# Patient Record
Sex: Female | Born: 1975 | Hispanic: No | Marital: Single | Smoking: Never smoker
Health system: Southern US, Community
[De-identification: ages and names within clinical notes are randomized; demographics above are authoritative.]

## PROBLEM LIST (undated history)

## (undated) DIAGNOSIS — C9590 Leukemia, unspecified not having achieved remission: Secondary | ICD-10-CM

## (undated) DIAGNOSIS — F32A Depression, unspecified: Secondary | ICD-10-CM

## (undated) DIAGNOSIS — Z923 Personal history of irradiation: Secondary | ICD-10-CM

## (undated) DIAGNOSIS — C801 Malignant (primary) neoplasm, unspecified: Secondary | ICD-10-CM

## (undated) DIAGNOSIS — F329 Major depressive disorder, single episode, unspecified: Secondary | ICD-10-CM

## (undated) DIAGNOSIS — R569 Unspecified convulsions: Secondary | ICD-10-CM

## (undated) DIAGNOSIS — Z9221 Personal history of antineoplastic chemotherapy: Secondary | ICD-10-CM

## (undated) HISTORY — PX: GALLBLADDER SURGERY: SHX652

## (undated) HISTORY — DX: Major depressive disorder, single episode, unspecified: F32.9

## (undated) HISTORY — DX: Malignant (primary) neoplasm, unspecified: C80.1

## (undated) HISTORY — PX: BREAST SURGERY: SHX581

## (undated) HISTORY — DX: Depression, unspecified: F32.A

## (undated) HISTORY — DX: Unspecified convulsions: R56.9

---

## 2010-06-02 HISTORY — PX: REDUCTION MAMMAPLASTY: SUR839

## 2012-08-17 ENCOUNTER — Emergency Department: Payer: Self-pay | Admitting: Emergency Medicine

## 2012-08-17 LAB — URINALYSIS, COMPLETE
Bilirubin,UR: NEGATIVE
Blood: NEGATIVE
Leukocyte Esterase: NEGATIVE
Ph: 8 (ref 4.5–8.0)
Protein: NEGATIVE
RBC,UR: NONE SEEN /HPF (ref 0–5)

## 2012-08-17 LAB — PHENOBARBITAL LEVEL: Phenobarbital: 19.7 ug/mL (ref 15.0–40.0)

## 2012-08-17 LAB — BASIC METABOLIC PANEL
Calcium, Total: 8.5 mg/dL (ref 8.5–10.1)
Chloride: 107 mmol/L (ref 98–107)
EGFR (African American): >60
Glucose: 93 mg/dL (ref 65–99)
Osmolality: 280 (ref 275–301)
Potassium: 4.1 mmol/L (ref 3.5–5.1)
Sodium: 140 mmol/L (ref 136–145)

## 2012-08-17 LAB — CBC WITH DIFFERENTIAL/PLATELET
Basophil %: 0.5 %
Eosinophil #: 0 10*3/uL (ref 0.0–0.7)
Eosinophil %: 0.6 %
HCT: 38.7 % (ref 35.0–47.0)
HGB: 13.4 g/dL (ref 12.0–16.0)
MCH: 29.1 pg (ref 26.0–34.0)
MCHC: 34.5 g/dL (ref 32.0–36.0)
Monocyte #: 0.5 x10 3/mm (ref 0.2–0.9)
Monocyte %: 6.5 %
Neutrophil #: 4.8 10*3/uL (ref 1.4–6.5)
Neutrophil %: 68.8 %
RBC: 4.59 10*6/uL (ref 3.80–5.20)
RDW: 14.3 % (ref 11.5–14.5)

## 2012-08-17 LAB — PREGNANCY, URINE: Pregnancy Test, Urine: NEGATIVE m[IU]/mL

## 2012-11-11 ENCOUNTER — Ambulatory Visit: Payer: Self-pay

## 2012-11-11 LAB — MONONUCLEOSIS SCREEN: Mono Test: NEGATIVE

## 2012-11-11 LAB — RAPID STREP-A WITH REFLX: Micro Text Report: NEGATIVE

## 2012-11-11 LAB — CBC WITH DIFFERENTIAL/PLATELET
Basophil #: 0 10*3/uL (ref 0.0–0.1)
Basophil %: 0.7 %
Eosinophil #: 0 10*3/uL (ref 0.0–0.7)
Eosinophil %: 0.6 %
HCT: 38.1 % (ref 35.0–47.0)
HGB: 13 g/dL (ref 12.0–16.0)
Lymphocyte #: 1.9 10*3/uL (ref 1.0–3.6)
Lymphocyte %: 27.5 %
MCH: 28.7 pg (ref 26.0–34.0)
MCHC: 34.1 g/dL (ref 32.0–36.0)
MCV: 84 fL (ref 80–100)
Monocyte #: 0.4 x10 3/mm (ref 0.2–0.9)
Monocyte %: 6.5 %
Neutrophil #: 4.5 10*3/uL (ref 1.4–6.5)
Neutrophil %: 64.7 %
Platelet: 279 10*3/uL (ref 150–440)
RBC: 4.54 10*6/uL (ref 3.80–5.20)
RDW: 14.6 % — ABNORMAL HIGH (ref 11.5–14.5)
WBC: 6.9 10*3/uL (ref 3.6–11.0)

## 2013-02-05 ENCOUNTER — Telehealth: Payer: Self-pay | Admitting: Neurology

## 2013-02-05 NOTE — Telephone Encounter (Signed)
I received a call from her oral surgeon, she is planning on to have her tooth extracted at his office under light iv sedation, using versed, proprafol.  She has frequent complex partial seizure, it should be ok to proceed with planned procedure at office, make sure that she is compliance with her medications.

## 2013-04-08 ENCOUNTER — Other Ambulatory Visit: Payer: Self-pay

## 2013-04-08 MED ORDER — PHENOBARBITAL 32.4 MG PO TABS: 32.4000 mg | ORAL_TABLET | Freq: Two times a day (BID) | ORAL | Status: DC

## 2013-04-08 NOTE — Telephone Encounter (Signed)
[facility] called, left message requesting refills on Phenobarb for this patient.

## 2013-08-08 ENCOUNTER — Ambulatory Visit: Payer: Self-pay | Admitting: Family Medicine

## 2013-08-26 DIAGNOSIS — F819 Developmental disorder of scholastic skills, unspecified: Secondary | ICD-10-CM | POA: Insufficient documentation

## 2013-08-27 DIAGNOSIS — G40909 Epilepsy, unspecified, not intractable, without status epilepticus: Secondary | ICD-10-CM | POA: Insufficient documentation

## 2013-09-10 ENCOUNTER — Other Ambulatory Visit: Payer: Self-pay

## 2013-09-10 MED ORDER — PHENOBARBITAL 32.4 MG PO TABS: 32.4000 mg | ORAL_TABLET | Freq: Two times a day (BID) | ORAL | Status: DC

## 2013-09-10 NOTE — Telephone Encounter (Signed)
Patient called requesting refills on Phenobarb.  She would like Rx sent to [facility], and has an appt scheduled in Feb. If there are any questions, she can be reached at [phone]

## 2013-09-12 NOTE — Telephone Encounter (Signed)
Rx faxed

## 2013-09-30 ENCOUNTER — Ambulatory Visit: Payer: Self-pay | Admitting: Nurse Practitioner

## 2013-10-21 ENCOUNTER — Ambulatory Visit (INDEPENDENT_AMBULATORY_CARE_PROVIDER_SITE_OTHER): Payer: Medicaid Other

## 2013-10-21 ENCOUNTER — Encounter: Payer: Self-pay | Admitting: Podiatry

## 2013-10-21 ENCOUNTER — Ambulatory Visit (INDEPENDENT_AMBULATORY_CARE_PROVIDER_SITE_OTHER): Payer: Medicaid Other | Admitting: Podiatry

## 2013-10-21 VITALS — BP 122/83 | HR 77 | Resp 16 | Ht <= 58 in | Wt 141.0 lb

## 2013-10-21 DIAGNOSIS — M722 Plantar fascial fibromatosis: Secondary | ICD-10-CM

## 2013-10-21 NOTE — Progress Notes (Signed)
   Subjective:    Patient ID: Dana Carroll, female    DOB: [birth year], 38 y.o.   MRN: [id number]  HPI Comments: Right back of heel pain is sore , its been going about 3 months at the most, just woke up  One day out of the blue with it hurting and it has been hurting since and its been about the same, some days have been worse , not able to walk and get out of the bed on her own, hurts more to walk on it . No type of treatment for the foot   Foot Pain  left plantar heel pain, not the right foot     Review of Systems  Neurological: Positive for seizures.  All other systems reviewed and are negative.       Objective:   Physical Exam I have reviewed her past medical history medications allergies surgeries and social history. Pulses are strongly palpable bilateral. Neurologic sensorium is intact bilateral. Orthopedic evaluation demonstrates pain on palpation medial continued tubercle of the heel. Ready graphic evaluation does demonstrate a plantar distally oriented calcaneal heel spur of the left heel with soft tissue increase in density at the plantar fascial calcaneal insertion site as well as the tendo Achilles insertion site.        Assessment & Plan:  Assessment: Plantar fasciitis and compensatory Achilles tendinitis left.  Plan: Discussed etiology pathology conservative versus surgical therapies injected the area today discussed appropriate shoe gear stretching exercises ice therapy shoe gear modifications. I will followup with her in one month.

## 2013-10-21 NOTE — Patient Instructions (Signed)
Plantar Fasciitis (Heel Spur Syndrome) with Rehab The plantar fascia is a fibrous, ligament-like, soft-tissue structure that spans the bottom of the foot. Plantar fasciitis is a condition that causes pain in the foot due to inflammation of the tissue. SYMPTOMS   Pain and tenderness on the underneath side of the foot.  Pain that worsens with standing or walking. CAUSES  Plantar fasciitis is caused by irritation and injury to the plantar fascia on the underneath side of the foot. Common mechanisms of injury include:  Direct trauma to bottom of the foot.  Damage to a small nerve that runs under the foot where the main fascia attaches to the heel bone.  Stress placed on the plantar fascia due to bone spurs. RISK INCREASES WITH:   Activities that place stress on the plantar fascia (running, jumping, pivoting, or cutting).  Poor strength and flexibility.  Improperly fitted shoes.  Tight calf muscles.  Flat feet.  Failure to warm-up properly before activity.  Obesity. PREVENTION  Warm up and stretch properly before activity.  Allow for adequate recovery between workouts.  Maintain physical fitness:  Strength, flexibility, and endurance.  Cardiovascular fitness.  Maintain a health body weight.  Avoid stress on the plantar fascia.  Wear properly fitted shoes, including arch supports for individuals who have flat feet. PROGNOSIS  If treated properly, then the symptoms of plantar fasciitis usually resolve without surgery. However, occasionally surgery is necessary. RELATED COMPLICATIONS   Recurrent symptoms that may result in a chronic condition.  Problems of the lower back that are caused by compensating for the injury, such as limping.  Pain or weakness of the foot during push-off following surgery.  Chronic inflammation, scarring, and partial or complete fascia tear, occurring more often from repeated injections. TREATMENT  Treatment initially involves the use of  ice and medication to help reduce pain and inflammation. The use of strengthening and stretching exercises may help reduce pain with activity, especially stretches of the Achilles tendon. These exercises may be performed at home or with a therapist. Your caregiver may recommend that you use heel cups of arch supports to help reduce stress on the plantar fascia. Occasionally, corticosteroid injections are given to reduce inflammation. If symptoms persist for greater than 6 months despite non-surgical (conservative), then surgery may be recommended.  MEDICATION   If pain medication is necessary, then nonsteroidal anti-inflammatory medications, such as aspirin and ibuprofen, or other minor pain relievers, such as acetaminophen, are often recommended.  Do not take pain medication within 7 days before surgery.  Prescription pain relievers may be given if deemed necessary by your caregiver. Use only as directed and only as much as you need.  Corticosteroid injections may be given by your caregiver. These injections should be reserved for the most serious cases, because they may only be given a certain number of times. HEAT AND COLD  Cold treatment (icing) relieves pain and reduces inflammation. Cold treatment should be applied for 10 to 15 minutes every 2 to 3 hours for inflammation and pain and immediately after any activity that aggravates your symptoms. Use ice packs or massage the area with a piece of ice (ice massage).  Heat treatment may be used prior to performing the stretching and strengthening activities prescribed by your caregiver, physical therapist, or athletic trainer. Use a heat pack or soak the injury in warm water. SEEK IMMEDIATE MEDICAL CARE IF:  Treatment seems to offer no benefit, or the condition worsens.  Any medications produce adverse side effects. EXERCISES RANGE   OF MOTION (ROM) AND STRETCHING EXERCISES - Plantar Fasciitis (Heel Spur Syndrome) These exercises may help you  when beginning to rehabilitate your injury. Your symptoms may resolve with or without further involvement from your physician, physical therapist or athletic trainer. While completing these exercises, remember:   Restoring tissue flexibility helps normal motion to return to the joints. This allows healthier, less painful movement and activity.  An effective stretch should be held for at least 30 seconds.  A stretch should never be painful. You should only feel a gentle lengthening or release in the stretched tissue. RANGE OF MOTION - Toe Extension, Flexion  Sit with your right / left leg crossed over your opposite knee.  Grasp your toes and gently pull them back toward the top of your foot. You should feel a stretch on the bottom of your toes and/or foot.  Hold this stretch for __________ seconds.  Now, gently pull your toes toward the bottom of your foot. You should feel a stretch on the top of your toes and or foot.  Hold this stretch for __________ seconds. Repeat __________ times. Complete this stretch __________ times per day.  RANGE OF MOTION - Ankle Dorsiflexion, Active Assisted  Remove shoes and sit on a chair that is preferably not on a carpeted surface.  Place right / left foot under knee. Extend your opposite leg for support.  Keeping your heel down, slide your right / left foot back toward the chair until you feel a stretch at your ankle or calf. If you do not feel a stretch, slide your bottom forward to the edge of the chair, while still keeping your heel down.  Hold this stretch for __________ seconds. Repeat __________ times. Complete this stretch __________ times per day.  STRETCH  Gastroc, Standing  Place hands on wall.  Extend right / left leg, keeping the front knee somewhat bent.  Slightly point your toes inward on your back foot.  Keeping your right / left heel on the floor and your knee straight, shift your weight toward the wall, not allowing your back to  arch.  You should feel a gentle stretch in the right / left calf. Hold this position for __________ seconds. Repeat __________ times. Complete this stretch __________ times per day. STRETCH  Soleus, Standing  Place hands on wall.  Extend right / left leg, keeping the other knee somewhat bent.  Slightly point your toes inward on your back foot.  Keep your right / left heel on the floor, bend your back knee, and slightly shift your weight over the back leg so that you feel a gentle stretch deep in your back calf.  Hold this position for __________ seconds. Repeat __________ times. Complete this stretch __________ times per day. STRETCH  Gastrocsoleus, Standing  Note: This exercise can place a lot of stress on your foot and ankle. Please complete this exercise only if specifically instructed by your caregiver.   Place the ball of your right / left foot on a step, keeping your other foot firmly on the same step.  Hold on to the wall or a rail for balance.  Slowly lift your other foot, allowing your body weight to press your heel down over the edge of the step.  You should feel a stretch in your right / left calf.  Hold this position for __________ seconds.  Repeat this exercise with a slight bend in your right / left knee. Repeat __________ times. Complete this stretch __________ times per day.    STRENGTHENING EXERCISES - Plantar Fasciitis (Heel Spur Syndrome)  These exercises may help you when beginning to rehabilitate your injury. They may resolve your symptoms with or without further involvement from your physician, physical therapist or athletic trainer. While completing these exercises, remember:   Muscles can gain both the endurance and the strength needed for everyday activities through controlled exercises.  Complete these exercises as instructed by your physician, physical therapist or athletic trainer. Progress the resistance and repetitions only as guided. STRENGTH - Towel  Curls  Sit in a chair positioned on a non-carpeted surface.  Place your foot on a towel, keeping your heel on the floor.  Pull the towel toward your heel by only curling your toes. Keep your heel on the floor.  If instructed by your physician, physical therapist or athletic trainer, add ____________________ at the end of the towel. Repeat __________ times. Complete this exercise __________ times per day. STRENGTH - Ankle Inversion  Secure one end of a rubber exercise band/tubing to a fixed object (table, pole). Loop the other end around your foot just before your toes.  Place your fists between your knees. This will focus your strengthening at your ankle.  Slowly, pull your big toe up and in, making sure the band/tubing is positioned to resist the entire motion.  Hold this position for __________ seconds.  Have your muscles resist the band/tubing as it slowly pulls your foot back to the starting position. Repeat __________ times. Complete this exercises __________ times per day.  Document Released: 09/18/2005 Document Revised: 12/11/2011 Document Reviewed: 12/31/2008 Exit[employer] Patient Information 2014 [employer]. Plantar Fasciitis Plantar fasciitis is a common condition that causes foot pain. It is soreness (inflammation) of the band of tough fibrous tissue on the bottom of the foot that runs from the heel bone (calcaneus) to the ball of the foot. The cause of this soreness may be from excessive standing, poor fitting shoes, running on hard surfaces, being overweight, having an abnormal walk, or overuse (this is common in runners) of the painful foot or feet. It is also common in aerobic exercise dancers and ballet dancers. SYMPTOMS  Most people with plantar fasciitis complain of:  Severe pain in the morning on the bottom of their foot especially when taking the first steps out of bed. This pain recedes after a few minutes of walking.  Severe pain is experienced also during walking  following a long period of inactivity.  Pain is worse when walking barefoot or up stairs DIAGNOSIS   Your caregiver will diagnose this condition by examining and feeling your foot.  Special tests such as X-rays of your foot, are usually not needed. PREVENTION   Consult a sports medicine professional before beginning a new exercise program.  Walking programs offer a good workout. With walking there is a lower chance of overuse injuries common to runners. There is less impact and less jarring of the joints.  Begin all new exercise programs slowly. If problems or pain develop, decrease the amount of time or distance until you are at a comfortable level.  Wear good shoes and replace them regularly.  Stretch your foot and the heel cords at the back of the ankle (Achilles tendon) both before and after exercise.  Run or exercise on even surfaces that are not hard. For example, asphalt is better than pavement.  Do not run barefoot on hard surfaces.  If using a treadmill, vary the incline.  Do not continue to workout if you have foot or joint   problems. Seek professional help if they do not improve. HOME CARE INSTRUCTIONS   Avoid activities that cause you pain until you recover.  Use ice or cold packs on the problem or painful areas after working out.  Only take over-the-counter or prescription medicines for pain, discomfort, or fever as directed by your caregiver.  Soft shoe inserts or athletic shoes with air or gel sole cushions may be helpful.  If problems continue or become more severe, consult a sports medicine caregiver or your own health care provider. Cortisone is a potent anti-inflammatory medication that may be injected into the painful area. You can discuss this treatment with your caregiver. MAKE SURE YOU:   Understand these instructions.  Will watch your condition.  Will get help right away if you are not doing well or get worse. Document Released: 06/13/2001 Document  Revised: 12/11/2011 Document Reviewed: 08/12/2008 Exit[employer] Patient Information 2014 [employer].  

## 2013-11-18 ENCOUNTER — Ambulatory Visit: Payer: Medicaid Other | Admitting: Podiatry

## 2013-11-21 ENCOUNTER — Encounter: Payer: Self-pay | Admitting: Nurse Practitioner

## 2013-11-24 ENCOUNTER — Ambulatory Visit (INDEPENDENT_AMBULATORY_CARE_PROVIDER_SITE_OTHER): Payer: Medicaid Other | Admitting: Nurse Practitioner

## 2013-11-24 ENCOUNTER — Encounter (INDEPENDENT_AMBULATORY_CARE_PROVIDER_SITE_OTHER): Payer: Self-pay

## 2013-11-24 ENCOUNTER — Encounter: Payer: Self-pay | Admitting: Nurse Practitioner

## 2013-11-24 VITALS — BP 120/73 | HR 69 | Ht <= 58 in | Wt 143.0 lb

## 2013-11-24 DIAGNOSIS — Z79899 Other long term (current) drug therapy: Secondary | ICD-10-CM

## 2013-11-24 DIAGNOSIS — R569 Unspecified convulsions: Secondary | ICD-10-CM

## 2013-11-24 DIAGNOSIS — G40209 Localization-related (focal) (partial) symptomatic epilepsy and epileptic syndromes with complex partial seizures, not intractable, without status epilepticus: Secondary | ICD-10-CM

## 2013-11-24 DIAGNOSIS — F79 Unspecified intellectual disabilities: Secondary | ICD-10-CM

## 2013-11-24 NOTE — Patient Instructions (Addendum)
Will obtain trough labs in the am. Lab orders to patient Will reorder meds after labs back F/U yearly

## 2013-11-24 NOTE — Progress Notes (Signed)
[facility]  PATIENT: Tommie Raymond DOB: [birth year]   REASON FOR VISIT: follow up for seizure   HISTORY OF PRESENT ILLNESS:Ms Babiarz, 38 year old follows up for seizure disorder. She continues to have 2 to 3 seizures around her menstral cycle.Her episodes are staring  into space, confusion, lasting 1 to 2 minutes, followed by post event sleepiness, exhaustion, but there was no generalized body tonic-clonic movements. This is at her best seizure control. She is currently taking Phenobarb and Lamictal without side effects. Her other seizure trigger is lack of sleep. Patient continues to rely on boyfriend to answer questions. No new medical issues since last visit 10/04/2012.    HISTORY: She and her mother moved from [place], to be close to her family, she was previously under the care of [facility]. She has a history of seizures since age 54, also with past medical history of leukemia at 38 years old, status post chemotherapy, stroke at age 63, wasn't sure which side was affected. She also has a history of learning disability, mental retardation, but graduate from special-education of 12th grade.  Her first grand mal seizure was at age 52, she was put on epileptic medications, over the past 10 years, she is under the care of Dr. Tasia Catchings at [place], there is gradual titration of her Lamitrogen to current 150 + 25mg  bid, also PB 15mg  III bd, she no longer has grand mal seizure, but she has complex partial seizure. Mother reported often before her menstruation period of time, she would have  3 or 4 episodes of staring  into space, confusion, lasting 1 to 2 minutes, followed by post event sleepiness, exhaustion, but there was no generalized body tonic-clonic movements. This is at her best seizure control. She lives home with her mother, there was no family history of epilepsy. She denied lateralized motor or sensory deficit, relies on her mother to provide  history  10/04/12: Patient returns for followup. She is with her boyfriend who says she has she can have 3 to 4 events during her menstral cycles and seizures when she has not slept well. Pt relies on boyfriend to answer questions. She is in school doing well at present he claims.     REVIEW OF SYSTEMS: Full 14 system review of systems performed and notable only for those listed, all others are neg:  Constitutional: fatigue Cardiovascular: N/A  Ear/Nose/Throat: N/A  Skin: N/A  Eyes: N/A  Respiratory: N/A  Gastroitestinal: constipation Hematology/Lymphatic: N/A  Endocrine: N/A Musculoskeletal:N/A  Allergy/Immunology: N/A  Neurological: seizure Psychiatric: N/A   ALLERGIES: No Known Allergies  HOME MEDICATIONS: Outpatient Prescriptions Prior to Visit  Medication Sig Dispense Refill  . lamiVUDine (EPIVIR) 150 MG tablet Take 150 mg by mouth 2 (two) times daily.      . naproxen sodium (ANAPROX) 220 MG tablet Take 220 mg by mouth as needed.      Marland Kitchen PHENobarbital (LUMINAL) 32.4 MG tablet Take 1 tablet (32.4 mg total) by mouth 2 (two) times daily.  60 tablet  3   No facility-administered medications prior to visit.    PAST MEDICAL HISTORY: Past Medical History  Diagnosis Date  . Seizures   . Depression   . Cancer     PAST SURGICAL HISTORY: Past Surgical History  Procedure Laterality Date  . Breast surgery    . Gallbladder surgery      FAMILY HISTORY: Family History  Problem Relation Age of Onset  . Diabetes    .  High blood pressure      SOCIAL HISTORY: History   Social History  . Marital Status: Single    Spouse Name: N/A    Number of Children: N/A  . Years of Education: N/A   Occupational History  . Unemployed     Social History Main Topics  . Smoking status: Never Smoker   . Smokeless tobacco: Never Used  . Alcohol Use: No  . Drug Use: No  . Sexual Activity: Not on file   Other Topics Concern  . Not on file   Social History Narrative   Patient  lives at home with her mother [name].    Patient does not work.    Patient is single.    Patient drinks caffeine rare.      PHYSICAL EXAM  Filed Vitals:   11/24/13 1043  BP: 120/73  Pulse: 69  Height: 4' 9.5" (1.461 m)  Weight: 143 lb (64.864 kg)   Body mass index is 30.39 kg/(m^2).  Generalized: Well developed, in no acute distress   Neurological examination   Mentation: Alert oriented to time, place, history taking. Follows most  Commands,  speech and language fluent  Cranial nerve II-XII: Pupils were equal round reactive to light extraocular movements were full, visual field were full on confrontational test. Facial sensation and strength were normal. hearing was intact to finger rubbing bilaterally. Uvula tongue midline. head turning and shoulder shrug were normal and symmetric.Tongue protrusion into cheek strength was normal. Motor: normal bulk and tone, full strength in the BUE, BLE,  No focal weakness Coordination: finger-nose-finger, heel-to-shin bilaterally, no dysmetria Reflexes: Brachioradialis 2/2, biceps 2/2, triceps 2/2, patellar 2/2, Achilles 2/2, plantar responses were flexor bilaterally. Gait and Station: Rising up from seated position without assistance, normal stance,  moderate stride, can  perform tiptoe, and heel walking without difficulty. Tandem gait is steady  DIAGNOSTIC DATA (LABS, IMAGING, TESTING) ASSESSMENT AND PLAN  38 y.o. year old female  has a past medical history of Seizures;complex partial here for followup. She also has past history of stroke, leukemia and MR.  Will obtain trough labs in the am. Lab orders to patient with directions to [facility] in [place]. Will reorder meds after labs back F/U yearly Dennie Bible, [facility], [facility], [facility] [address] [place], [place] [address] [phone]

## 2013-12-03 ENCOUNTER — Telehealth: Payer: Self-pay | Admitting: Nurse Practitioner

## 2013-12-03 ENCOUNTER — Other Ambulatory Visit: Payer: Self-pay | Admitting: Neurology

## 2013-12-03 MED ORDER — PHENOBARBITAL 32.4 MG PO TABS: 32.4000 mg | ORAL_TABLET | Freq: Two times a day (BID) | ORAL | Status: DC

## 2013-12-03 MED ORDER — LAMOTRIGINE 150 MG PO TABS: 150.0000 mg | ORAL_TABLET | Freq: Two times a day (BID) | ORAL | Status: DC

## 2013-12-03 NOTE — Telephone Encounter (Signed)
Please let patient know labs are good. Meds renewed

## 2013-12-04 NOTE — Telephone Encounter (Signed)
Called patient and left message informing her that her Lab results were normal and that her Medication had been renewed and if she has any other problems, questions or concerns to call the office.

## 2013-12-04 NOTE — Telephone Encounter (Signed)
TC to [place] voice mail and  made her aware that the labs sent to me by [facility] were from 2014 not 2015. I overlooked the date.I have called the pharmacy to only give refills for 1 month. Your labs need to be checked. Call if you have questions

## 2013-12-16 ENCOUNTER — Encounter: Payer: Self-pay | Admitting: Diagnostic Neuroimaging

## 2014-01-15 ENCOUNTER — Encounter: Payer: Self-pay | Admitting: Specialist

## 2014-01-30 ENCOUNTER — Encounter: Payer: Self-pay | Admitting: Specialist

## 2014-07-01 ENCOUNTER — Other Ambulatory Visit: Payer: Self-pay | Admitting: Neurology

## 2014-07-01 ENCOUNTER — Other Ambulatory Visit: Payer: Self-pay

## 2014-07-01 MED ORDER — LAMOTRIGINE 150 MG PO TABS: 150.0000 mg | ORAL_TABLET | Freq: Two times a day (BID) | ORAL | Status: DC

## 2014-07-01 NOTE — Telephone Encounter (Signed)
Patient's mother [name] calling on behalf of patient requesting refill of Phenobarbital, please return call and advise when refill request has been sent.

## 2014-07-01 NOTE — Telephone Encounter (Signed)
Request forwarded to provider for approval  

## 2014-07-02 ENCOUNTER — Other Ambulatory Visit: Payer: Self-pay

## 2014-07-02 NOTE — Telephone Encounter (Signed)
Mother calling checking status of Rx refill request for PHENobarbital (LUMINAL) 32.4 MG tablet.

## 2014-07-03 MED ORDER — PHENOBARBITAL 32.4 MG PO TABS: 32.4000 mg | ORAL_TABLET | Freq: Two times a day (BID) | ORAL | Status: DC

## 2014-07-03 NOTE — Telephone Encounter (Signed)
Called patient back regarding Rx.

## 2014-07-03 NOTE — Telephone Encounter (Signed)
Called patient back.  Left message advising we will fax Rx once provider signs/approves it.

## 2014-07-03 NOTE — Telephone Encounter (Signed)
Patient's mother [name] called and questioned if patient's medication (2 pills left) runs out before they get Rx, what should she do?  Please return mom's call to [phone].

## 2014-11-02 ENCOUNTER — Telehealth: Payer: Self-pay | Admitting: Neurology

## 2014-11-02 ENCOUNTER — Other Ambulatory Visit: Payer: Self-pay

## 2014-11-02 NOTE — Telephone Encounter (Signed)
Patient is calling for written Rx phenobardital and mimectal.  Please call. New pharmacy is [facility] [phone].

## 2014-11-02 NOTE — Telephone Encounter (Signed)
Please refer to MRN:  [id number]

## 2014-11-03 MED ORDER — LAMOTRIGINE 150 MG PO TABS: 150.0000 mg | ORAL_TABLET | Freq: Two times a day (BID) | ORAL | Status: DC

## 2014-11-03 MED ORDER — PHENOBARBITAL 32.4 MG PO TABS: 32.4000 mg | ORAL_TABLET | Freq: Two times a day (BID) | ORAL | Status: DC

## 2014-12-30 ENCOUNTER — Other Ambulatory Visit: Payer: Self-pay | Admitting: Nurse Practitioner

## 2014-12-31 NOTE — Telephone Encounter (Signed)
Duplicate.  6 refills were sent in Feb

## 2015-01-26 ENCOUNTER — Telehealth: Payer: Self-pay | Admitting: *Deleted

## 2015-01-26 NOTE — Telephone Encounter (Signed)
LMVM (home and cell)  for pt to return call about labs requested (back when seen 11-24-13).  [facility] has no record.  Did she go to other lab? [facility]?

## 2015-01-27 ENCOUNTER — Ambulatory Visit (INDEPENDENT_AMBULATORY_CARE_PROVIDER_SITE_OTHER): Payer: Medicare Other | Admitting: Nurse Practitioner

## 2015-01-27 ENCOUNTER — Encounter: Payer: Self-pay | Admitting: Nurse Practitioner

## 2015-01-27 VITALS — BP 127/86 | HR 82 | Ht <= 58 in | Wt 141.6 lb

## 2015-01-27 DIAGNOSIS — G40209 Localization-related (focal) (partial) symptomatic epilepsy and epileptic syndromes with complex partial seizures, not intractable, without status epilepticus: Secondary | ICD-10-CM

## 2015-01-27 DIAGNOSIS — F79 Unspecified intellectual disabilities: Secondary | ICD-10-CM

## 2015-01-27 DIAGNOSIS — Z5181 Encounter for therapeutic drug level monitoring: Secondary | ICD-10-CM | POA: Diagnosis not present

## 2015-01-27 NOTE — Patient Instructions (Signed)
Take phenobarbital tablets at night will refill Lamictal to continue at current dose will refill Check labs today Follow-up yearly and when necessary

## 2015-01-27 NOTE — Progress Notes (Signed)
[facility]  PATIENT: Dana Carroll DOB: 1976/08/29   REASON FOR VISIT: Follow-up for seizure disorder  HISTORY FROM: Patient and boyfriend    HISTORY OF PRESENT ILLNESS:Dana Carroll, 39 year old follows up for seizure disorder. She was last seen in the office 11/24/2013. She has a history of generalized tonic-clonic movements. She continues to have 2 to 3 seizures around her menstral cycle.Her episodes are staring into space, confusion, lasting 1 to 2 minutes, followed by post event sleepiness, exhaustion, but there was no generalized body tonic-clonic movements. This is at her best seizure control. She is currently taking Phenobarb and Lamictal without side effects. Her other seizure trigger is lack of sleep. Patient  to relies on boyfriend to answer questions. No new medical issues since last visit.. She did not get her labs done after her last visit. She needs refills    HISTORY: She and her mother moved from [place], to be close to her family, she was previously under the care of [facility]. She has a history of seizures since age 55, also with past medical history of leukemia at 39 years old, status post chemotherapy, stroke at age 89, wasn't sure which side was affected. She also has a history of learning disability, mental retardation, but graduate from special-education of 12th grade.  Her first grand mal seizure was at age 38, she was put on epileptic medications, over the past 10 years, she is under the care of Dr. Tasia Catchings at [place], there is gradual titration of her Lamitrogen to current 150 + 25mg  bid, also PB 15mg  III bd, she no longer has grand mal seizure, but she has complex partial seizure. Mother reported often before her menstruation period of time, she would have 3 or 4 episodes of staring into space, confusion, lasting 1 to 2 minutes, followed by post event sleepiness, exhaustion, but there was no generalized body tonic-clonic  movements. This is at her best seizure control. She lives home with her mother, there was no family history of epilepsy. She denied lateralized motor or sensory deficit, relies on her mother to provide history  10/04/12: Patient returns for followup. She is with her boyfriend who says she has she can have 3 to 4 events during her menstral cycles and seizures when she has not slept well. Pt relies on boyfriend to answer questions. She is in school doing well at present he claims.   REVIEW OF SYSTEMS: Full 14 system review of systems performed and notable only for those listed, all others are neg:  Constitutional: Fatigue  Cardiovascular: neg Ear/Nose/Throat: neg  Skin: neg Eyes: neg Respiratory: neg Gastroitestinal: neg  Hematology/Lymphatic: neg  Endocrine: neg Musculoskeletal:neg Allergy/Immunology: neg Neurological: neg Psychiatric: neg Sleep : neg   ALLERGIES: No Known Allergies  HOME MEDICATIONS: Outpatient Prescriptions Prior to Visit  Medication Sig Dispense Refill  . lamoTRIgine (LAMICTAL) 150 MG tablet Take 1 tablet (150 mg total) by mouth 2 (two) times daily. 180 tablet 1  . PHENobarbital (LUMINAL) 32.4 MG tablet Take 1 tablet (32.4 mg total) by mouth 2 (two) times daily. 60 tablet 5  . naproxen sodium (ANAPROX) 220 MG tablet Take 220 mg by mouth as needed.     No facility-administered medications prior to visit.    PAST MEDICAL HISTORY: Past Medical History  Diagnosis Date  . Seizures   . Depression   . Cancer     PAST SURGICAL HISTORY: Past Surgical History  Procedure Laterality Date  .  Breast surgery    . Gallbladder surgery      FAMILY HISTORY: Family History  Problem Relation Age of Onset  . Diabetes    . High blood pressure      SOCIAL HISTORY: History   Social History  . Marital Status: Single    Spouse Name: N/A  . Number of Children: N/A  . Years of Education: HS Grad   Occupational History  . Unemployed     Social History Main  Topics  . Smoking status: Never Smoker   . Smokeless tobacco: Never Used  . Alcohol Use: No  . Drug Use: No  . Sexual Activity: Not on file   Other Topics Concern  . Not on file   Social History Narrative   Patient lives at home with boyfriend, Dana Carroll   Patient does not work.    Patient is single.    Patient drinks caffeine rare.      PHYSICAL EXAM  Filed Vitals:   01/27/15 1336  BP: 127/86  Pulse: 82  Height: 4\' 8"  (1.422 m)  Weight: 141 lb 9.6 oz (64.229 kg)   Body mass index is 31.76 kg/(m^2). Generalized: Well developed, obese female in no acute distress   Neurological examination   Mentation: Alert oriented to time, place, history taking. Follows most Commands, speech and language fluent. MR  Cranial nerve II-XII: Pupils were equal round reactive to light extraocular movements were full, visual field were full on confrontational test. Facial sensation and strength were normal. hearing was intact to finger rubbing bilaterally. Uvula tongue midline. head turning and shoulder shrug were normal and symmetric.Tongue protrusion into cheek strength was normal. Motor: normal bulk and tone, full strength in the BUE, BLE, No focal weakness Coordination: finger-nose-finger, heel-to-shin bilaterally, no dysmetria Reflexes: Brachioradialis 2/2, biceps 2/2, triceps 2/2, patellar 2/2, Achilles 2/2, plantar responses were flexor bilaterally. Gait and Station: Rising up from seated position without assistance, normal stance, moderate stride, can perform tiptoe, and heel walking without difficulty. Tandem gait is steady DIAGNOSTIC DATA (LABS, IMAGING, TESTING) -  ASSESSMENT AND PLAN  39 y.o. year old female  has a past medical history of Seizures; here to follow-up. She also has mild MR. She continues to have partial seizures around her menstrual cycle. She is currently on phenobarbital and Lamictal without recent labs  Take phenobarbital tablets at night will refill after labs  return Lamictal to continue at current dose will refill, after labs return Check labs today, CBC, CMP PB ,and Lamictal level Follow-up yearly and when necessary Dana Carroll, [facility], [facility], [facility] [address] [place], [place] [address] [phone]

## 2015-01-27 NOTE — Telephone Encounter (Signed)
Spoke to mother.   Labs not done.  She called around to see if they had done.  She could not find any.  I told her to keep appt and will get labs. (may be trough levels).  They live in [place] area.

## 2015-01-28 ENCOUNTER — Other Ambulatory Visit: Payer: Self-pay | Admitting: Nurse Practitioner

## 2015-01-28 LAB — COMPREHENSIVE METABOLIC PANEL
A/G RATIO: 1.6 (ref 1.1–2.5)
ALT: 27 IU/L (ref 0–32)
AST: 21 IU/L (ref 0–40)
Albumin: 4.3 g/dL (ref 3.5–5.5)
Alkaline Phosphatase: 78 IU/L (ref 39–117)
BILIRUBIN TOTAL: 0.2 mg/dL (ref 0.0–1.2)
BUN / CREAT RATIO: 13 (ref 8–20)
BUN: 13 mg/dL (ref 6–20)
CHLORIDE: 99 mmol/L (ref 97–108)
CO2: 26 mmol/L (ref 18–29)
Calcium: 9.8 mg/dL (ref 8.7–10.2)
Creatinine, Ser: 1.04 mg/dL — ABNORMAL HIGH (ref 0.57–1.00)
GFR calc Af Amer: 79 mL/min/{1.73_m2} (ref >59)
GFR calc non Af Amer: 69 mL/min/{1.73_m2} (ref >59)
Globulin, Total: 2.7 g/dL (ref 1.5–4.5)
Glucose: 93 mg/dL (ref 65–99)
POTASSIUM: 4.9 mmol/L (ref 3.5–5.2)
SODIUM: 140 mmol/L (ref 134–144)
Total Protein: 7 g/dL (ref 6.0–8.5)

## 2015-01-28 LAB — CBC WITH DIFFERENTIAL/PLATELET
Basophils Absolute: 0 10*3/uL (ref 0.0–0.2)
Basos: 0 %
EOS (ABSOLUTE): 0 10*3/uL (ref 0.0–0.4)
Eos: 0 %
Hematocrit: 36.2 % (ref 34.0–46.6)
Hemoglobin: 12.6 g/dL (ref 11.1–15.9)
IMMATURE GRANS (ABS): 0 10*3/uL (ref 0.0–0.1)
IMMATURE GRANULOCYTES: 0 %
LYMPHS ABS: 2.4 10*3/uL (ref 0.7–3.1)
Lymphs: 29 %
MCH: 28.5 pg (ref 26.6–33.0)
MCHC: 34.8 g/dL (ref 31.5–35.7)
MCV: 82 fL (ref 79–97)
Monocytes Absolute: 0.4 10*3/uL (ref 0.1–0.9)
Monocytes: 5 %
NEUTROS PCT: 66 %
Neutrophils Absolute: 5.5 10*3/uL (ref 1.4–7.0)
Platelets: 329 10*3/uL (ref 150–379)
RBC: 4.42 x10E6/uL (ref 3.77–5.28)
RDW: 15.4 % (ref 12.3–15.4)
WBC: 8.4 10*3/uL (ref 3.4–10.8)

## 2015-01-28 LAB — LAMOTRIGINE LEVEL: Lamotrigine Lvl: 6.2 ug/mL (ref 2.0–20.0)

## 2015-01-28 LAB — PHENOBARBITAL LEVEL: Phenobarbital Lvl: 16 ug/mL (ref 15–40)

## 2015-01-28 MED ORDER — PHENOBARBITAL 32.4 MG PO TABS: 64.8000 mg | ORAL_TABLET | Freq: Every day | ORAL | Status: DC

## 2015-01-28 MED ORDER — LAMOTRIGINE 150 MG PO TABS: 150.0000 mg | ORAL_TABLET | Freq: Two times a day (BID) | ORAL | Status: DC

## 2015-01-28 NOTE — Progress Notes (Signed)
Quick Note:  LMVM for pt/ Dana Carroll, boyfriend that labs that she had drawn yesterday ok. Continue with same dose of medications. CM/NP to refill. She is to call back if questions. ______

## 2015-01-29 NOTE — Progress Notes (Signed)
I have reviewed and agreed above plan. 

## 2015-08-08 ENCOUNTER — Other Ambulatory Visit: Payer: Self-pay | Admitting: Nurse Practitioner

## 2015-08-08 ENCOUNTER — Telehealth: Payer: Self-pay | Admitting: Neurology

## 2015-08-08 MED ORDER — PHENOBARBITAL 32.4 MG PO TABS: 64.8000 mg | ORAL_TABLET | Freq: Every day | ORAL | Status: DC

## 2015-08-08 NOTE — Telephone Encounter (Signed)
The mother called, the patient needs a RF on the phenobarbital

## 2015-08-11 ENCOUNTER — Telehealth: Payer: Self-pay | Admitting: Neurology

## 2015-08-11 NOTE — Telephone Encounter (Signed)
Mother called requesting to speak to Doctor, feels like daughter hasn't been seen by Doctor in years, has seen the Nurse Practitioner. Mother (not patient) wanted appointment to come and talk to the Doctor about new medication, "new marijuana pill for seizures". Mother also wanted to know when patient had last labwork. I advised Mom that we don't have a DPR on file to release information, mom put patient on the phone, advised of last labwork and date last seen by NP and date of next appointment with NP. Also advised patient that the next time she is here or if she wants to stop by our office, she can fill out and sign a  DPR so that we can talk to Mom.

## 2015-11-11 ENCOUNTER — Telehealth: Payer: Self-pay | Admitting: Neurology

## 2015-11-11 NOTE — Telephone Encounter (Signed)
Returned call and scheduled an appt with Dr. Krista Blue to further discuss medication changes.

## 2015-11-11 NOTE — Telephone Encounter (Signed)
Patient's mom came in and gave [place] the POA paper work and they updated the DPR. The mom said she would like to speak with Dr. Krista Blue she said a phone conference to update her with what is going on. She also wants to speak about a new drug that she has been talking to other doctors about and she wants to know if she can be a candidate for that new drug. The best number to contact the mom is  [phone]

## 2015-12-06 ENCOUNTER — Ambulatory Visit (INDEPENDENT_AMBULATORY_CARE_PROVIDER_SITE_OTHER): Payer: Medicare Other | Admitting: Neurology

## 2015-12-06 ENCOUNTER — Encounter: Payer: Self-pay | Admitting: Neurology

## 2015-12-06 VITALS — BP 142/84 | HR 73 | Ht <= 58 in | Wt 146.0 lb

## 2015-12-06 DIAGNOSIS — F79 Unspecified intellectual disabilities: Secondary | ICD-10-CM | POA: Diagnosis not present

## 2015-12-06 DIAGNOSIS — G40209 Localization-related (focal) (partial) symptomatic epilepsy and epileptic syndromes with complex partial seizures, not intractable, without status epilepticus: Secondary | ICD-10-CM

## 2015-12-06 MED ORDER — LAMOTRIGINE 100 MG PO TABS: ORAL_TABLET | ORAL | Status: DC

## 2015-12-06 MED ORDER — PHENOBARBITAL 32.4 MG PO TABS: 64.8000 mg | ORAL_TABLET | Freq: Every day | ORAL | Status: DC

## 2015-12-06 NOTE — Progress Notes (Signed)
Chief Complaint  Patient presents with  . Seizures    She is here with her mother, Dana Carroll.  Reports having frequent seizure activity, despite taking her medication as prescribed.     [facility]  PATIENT: Dana Carroll DOB: 05-11-1976  HISTORY OF PRESENT ILLNESS:  HISTORY: She and her mother moved from [place], to be close to her family, she was previously under the care of [facility]. She has a history of seizures since age 40, also with past medical history of leukemia at 40 years old, status post chemotherapy, stroke at age 40, wasn't sure which side was affected. She also has a history of learning disability, mental retardation, but graduate from special-education of 12th grade.  Her first grand mal seizure was at age 40, she was put on antiepileptic medications, over the past 10 years, she is under the care of Dr. Tasia Catchings at [place], there is gradual titration of her Lamitrogen to current 150 + 25mg  bid, also PB 15mg  3 tabs bid, she no longer has grand mal seizure, but she has complex partial seizure. Mother reported often before her menstruation period of time, she would have 3 or 4 episodes of staring into space, confusion, lasting 1 to 2 minutes, followed by post event sleepiness, exhaution. This is at her best seizure control. She lives home with her mother, there was no family history of epilepsy. She denied lateralized motor or sensory deficit, relies on her mother to provide history  10/04/12: Patient returns for followup. She is with her boyfriend who says she has she can have 3 to 4 events during her menstral cycles and seizures when she has not slept well. Pt relies on boyfriend to answer questions. She is in school doing well at present he claims.  UPDATE March 6th 2017: She is now living with her boyfriend, on disability, she is taking lamotrigine 150 mg in the morning, and phenobarbital 32.4 mg 2 tablets at night, last clinical visit  was April 2016, she still has frequent seizure around her menstruation, presented as staring spells, sometimes wandering around, this is the average seizure frequency at her baseline  I reviewed Lab in Nov 05 2015: normal CBC, normal CMP, phenobarbital level 7.4, lamotrigine level 8.4, iron level 38, ferritin 16,  Patient and her mother does want to have any medication changes at this point, mother reported patient has occasionally rash broke out on her back, and her face, suspicious her rash is due to lamotrigine, but patient has been taking lamotrigine for more than a decade  REVIEW OF SYSTEMS: Full 14 system review of systems performed and notable only for those listed, all others are neg: Seizure, speech difficulty, depression, anxiety, rash    ALLERGIES: No Known Allergies  HOME MEDICATIONS: Outpatient Prescriptions Prior to Visit  Medication Sig Dispense Refill  . lamoTRIgine (LAMICTAL) 150 MG tablet Take 1 tablet (150 mg total) by mouth 2 (two) times daily. 180 tablet 3  . PHENobarbital (LUMINAL) 32.4 MG tablet Take 2 tablets (64.8 mg total) by mouth at bedtime. 60 tablet 5   No facility-administered medications prior to visit.    PAST MEDICAL HISTORY: Past Medical History  Diagnosis Date  . Seizures ([place])   . Depression   . Cancer [facility])     PAST SURGICAL HISTORY: Past Surgical History  Procedure Laterality Date  . Breast surgery    . Gallbladder surgery      FAMILY HISTORY: Family History  Problem Relation Age  of Onset  . Diabetes    . High blood pressure      SOCIAL HISTORY: Social History   Social History  . Marital Status: Single    Spouse Name: N/A  . Number of Children: N/A  . Years of Education: HS Grad   Occupational History  . Unemployed     Social History Main Topics  . Smoking status: Never Smoker   . Smokeless tobacco: Never Used  . Alcohol Use: No  . Drug Use: No  . Sexual Activity: Not on file   Other Topics Concern  . Not on file    Social History Narrative   Patient lives at home with boyfriend, Hilaria Ota   Patient does not work.    Patient is single.    Patient drinks caffeine rare.      PHYSICAL EXAM  Filed Vitals:   12/06/15 0739  BP: 142/84  Pulse: 73  Height: 4\' 8"  (1.422 m)  Weight: 146 lb (66.225 kg)   Body mass index is 32.75 kg/(m^2).  PHYSICAL EXAMNIATION:  Gen: NAD, conversant, well nourised, obese, well groomed                     Cardiovascular: Regular rate rhythm, no peripheral edema, warm, nontender. Eyes: Conjunctivae clear without exudates or hemorrhage Neck: Supple, no carotid bruise. Pulmonary: Clear to auscultation bilaterally   NEUROLOGICAL EXAM:  MENTAL STATUS: Speech:    Speech is normal; fluent and spontaneous with normal comprehension.  Cognition:      Carry on normal conversation   CRANIAL NERVES: CN II: Visual fields are full to confrontation. Fundoscopic exam is normal with sharp discs and no vascular changes. Pupils are round equal and briskly reactive to light. CN III, IV, VI: extraocular movement are normal. No ptosis. CN V: Facial sensation is intact to pinprick in all 3 divisions bilaterally. Corneal responses are intact.  CN VII: Face is symmetric with normal eye closure and smile. CN VIII: Hearing is normal to rubbing fingers CN IX, X: Palate elevates symmetrically. Phonation is normal. CN XI: Head turning and shoulder shrug are intact CN XII: Tongue is midline with normal movements and no atrophy.  MOTOR: There is no pronator drift of out-stretched arms. Muscle bulk and tone are normal. Muscle strength is normal.  REFLEXES: Reflexes are 2+ and symmetric at the biceps, triceps, knees, and ankles. Plantar responses are flexor.  SENSORY: Intact to light touch, pinprick, position sense, and vibration sense are intact in fingers and toes.  COORDINATION: Rapid alternating movements and fine finger movements are intact. There is no dysmetria on finger-to-nose  and heel-knee-shin.    GAIT/STANCE: Posture is normal. Gait is steady with normal steps, base, arm swing, and turning. Heel and toe walking are normal. Tandem gait is normal.  Romberg is absent.    DIAGNOSTIC DATA (LABS, IMAGING, TESTING) -  ASSESSMENT AND PLAN  40 y.o. year old female  has a past medical history   Epilepsy  Continue current dose of phenobarbital 32.4 mg 2 tablets every night, increase lamotrigine to 2 tablets in the morning, 1 tablet at night  . Return to clinic in 6 months Mental retardation  Marcial Pacas, M.D. Ph.D.  [facility] [place], [place] [address] Phone: [phone] Fax:      [phone]

## 2016-01-27 ENCOUNTER — Ambulatory Visit: Payer: Medicare Other | Admitting: Nurse Practitioner

## 2016-06-07 ENCOUNTER — Other Ambulatory Visit: Payer: Self-pay | Admitting: Neurology

## 2016-06-07 ENCOUNTER — Telehealth: Payer: Self-pay | Admitting: Neurology

## 2016-06-07 NOTE — Telephone Encounter (Signed)
Boyfriend called to request refill of PHENobarbital (LUMINAL) 32.4 MG tablet, states patient is completely out of this medication.

## 2016-06-07 NOTE — Telephone Encounter (Signed)
Rx printed, signed [facility] at [phone].

## 2016-07-13 ENCOUNTER — Encounter: Payer: Self-pay | Admitting: Nurse Practitioner

## 2016-07-13 ENCOUNTER — Ambulatory Visit (INDEPENDENT_AMBULATORY_CARE_PROVIDER_SITE_OTHER): Payer: Medicare Other | Admitting: Nurse Practitioner

## 2016-07-13 VITALS — BP 116/84 | HR 80 | Ht <= 58 in | Wt 141.4 lb

## 2016-07-13 DIAGNOSIS — G40209 Localization-related (focal) (partial) symptomatic epilepsy and epileptic syndromes with complex partial seizures, not intractable, without status epilepticus: Secondary | ICD-10-CM

## 2016-07-13 DIAGNOSIS — F79 Unspecified intellectual disabilities: Secondary | ICD-10-CM

## 2016-07-13 MED ORDER — PHENOBARBITAL 32.4 MG PO TABS
64.8000 mg | ORAL_TABLET | Freq: Every day | ORAL | 1 refills | Status: DC
Start: 2016-07-13 — End: 2016-12-05

## 2016-07-13 NOTE — Progress Notes (Signed)
[facility]  PATIENT: Dana Carroll DOB: 1976/09/14   REASON FOR VISIT: Follow-up for seizure disorder, mental retardation HISTORY FROM: Patient, friend    HISTORY OF PRESENT ILLNESS: HISTORY: She and her mother moved from [place], to be close to her family, she was previously under the care of [facility]. She has a history of seizures since age 40, also with past medical history of leukemia at 40 years old, status post chemotherapy, stroke at age 89, wasn't sure which side was affected. She also has a history of learning disability, mental retardation, but graduate from special-education of 12th grade.  Her first grand mal seizure was at age 28, she was put on antiepileptic medications, over the past 10 years, she is under the care of Dr. Tasia Catchings at [place], there is gradual titration of her Lamitrogen to current 150 + 25mg  bid, also PB 15mg  3 tabs bid, she no longer has grand mal seizure, but she has complex partial seizure. Mother reported often before her menstruation period of time, she would have 3 or 4 episodes of staring into space, confusion, lasting 1 to 2 minutes, followed by post event sleepiness, exhaution. This is at her best seizure control. She lives home with her mother, there was no family history of epilepsy. She denied lateralized motor or sensory deficit, relies on her mother to provide history  10/04/12: Patient returns for followup. She is with her boyfriend who says she has she can have 3 to 4 events during her menstral cycles and seizures when she has not slept well. Pt relies on boyfriend to answer questions. She is in school doing well at present he claims.  UPDATE March 6th 2017YY She is now living with her boyfriend, on disability, she is taking lamotrigine 150 mg in the morning, and phenobarbital 32.4 mg 2 tablets at night, last clinical visit was April 2016, she still has frequent seizure around her menstruation,  presented as staring spells, sometimes wandering around, this is the average seizure frequency at her baseline I reviewed Lab in Nov 05 2015: normal CBC, normal CMP, phenobarbital level 7.4, lamotrigine level 8.4, iron level 38, ferritin 16, Patient and her mother does want to have any medication changes at this point, mother reported patient has occasionally rash broke out on her back, and her face, suspicious her rash is due to lamotrigine, but patient has been taking lamotrigine for more than a decade UPDATE 10/12/2017CM Dana Carroll, 40 year old female returns for follow-up. She has a history of seizure disorder and mild mental retardation. She reports that she had a seizure 2 days last week. On questioning the patient how she is currently taking medication. She is not taking her  bedtime dose of Lamictal, she is only taking the phenobarbital at night. Reinstructed both the patient and friend on how to take her medication properly. She returns for reevaluation  REVIEW OF SYSTEMS: Full 14 system review of systems performed and notable only for those listed, all others are neg:  Constitutional: Fatigue  Cardiovascular: neg Ear/Nose/Throat: neg  Skin: neg Eyes: neg Respiratory: neg Gastroitestinal: neg  Hematology/Lymphatic: neg  Endocrine: neg Musculoskeletal:neg Allergy/Immunology: neg Neurological: Seizure disorder Psychiatric: neg Sleep : neg   ALLERGIES: No Known Allergies  HOME MEDICATIONS: Outpatient Medications Prior to Visit  Medication Sig Dispense Refill  . lamoTRIgine (LAMICTAL) 100 MG tablet 2 tabs in morning, one tab po qhs 90 tablet 11  . PHENobarbital (LUMINAL) 32.4 MG tablet TAKE 2 TABLETS BY  MOUTH AT BEDTIME 180 tablet 1   No facility-administered medications prior to visit.     PAST MEDICAL HISTORY: Past Medical History:  Diagnosis Date  . Cancer ([place])   . Depression   . Seizures ([place])     PAST SURGICAL HISTORY: Past Surgical History:  Procedure  Laterality Date  . BREAST SURGERY    . GALLBLADDER SURGERY      FAMILY HISTORY: Family History  Problem Relation Age of Onset  . Diabetes    . High blood pressure      SOCIAL HISTORY: Social History   Social History  . Marital status: Single    Spouse name: N/A  . Number of children: N/A  . Years of education: HS Grad   Occupational History  . Unemployed     Social History Main Topics  . Smoking status: Never Smoker  . Smokeless tobacco: Never Used  . Alcohol use No  . Drug use: No  . Sexual activity: Not on file   Other Topics Concern  . Not on file   Social History Narrative   Patient lives at home with boyfriend, Hilaria Ota   Patient does not work.    Patient is single.    Patient drinks caffeine rare.      PHYSICAL EXAM  Vitals:   07/13/16 0929  BP: 116/84  Pulse: 80  Weight: 141 lb 6.4 oz (64.1 kg)  Height: 4\' 8"  (1.422 m)   Body mass index is 31.7 kg/m.  Generalized: Well developed, Obese female in no acute distress  Head: normocephalic and atraumatic,. Oropharynx benign  Neck: Supple, no carotid bruits  Cardiac: Regular rate rhythm, no murmur  Musculoskeletal: No deformity   Neurological examination   Mentation: Alert oriented to time, place, history taking. Attention span and concentration appropriate. Recent and remote memory intact.  Follows all commands speech and language fluent.   Cranial nerve II-XII: .Pupils were equal round reactive to light extraocular movements were full, visual field were full on confrontational test. Facial sensation and strength were normal. hearing was intact to finger rubbing bilaterally. Uvula tongue midline. head turning and shoulder shrug were normal and symmetric.Tongue protrusion into cheek strength was normal. Motor: normal bulk and tone, full strength in the BUE, BLE, fine finger movements normal, no pronator drift. No focal weakness Sensory: normal and symmetric to light touch, pinprick, and  Vibration, in  the fingers and toes Coordination: finger-nose-finger, heel-to-shin bilaterally, no dysmetria Reflexes: Brachioradialis 2/2, biceps 2/2, triceps 2/2, patellar 2/2, Achilles 2/2, plantar responses were flexor bilaterally. Gait and Station: Rising up from seated position without assistance, normal stance,  moderate stride, good arm swing, smooth turning, able to perform tiptoe, and heel walking without difficulty. Tandem gait is steady  DIAGNOSTIC DATA (LABS, IMAGING, TESTING) -  ASSESSMENT AND PLAN  40 y.o. year old female  has a past medical history of epilepsy and mild MR.  Patient had 2 recent seizures and she had been omitting  the  bedtime dose of Lamictal.    PLAN:  Continue Lamictal 100 mg, 2 in the morning and one at night Continue phenobarbital 2 tablets at night Discussed the importance of taking the medication as prescribed Return to the clinic in 6 months Dennie Bible, [facility], [facility], [facility] [address] [place], [place] [address] [phone]

## 2016-07-13 NOTE — Patient Instructions (Signed)
Continue Lamictal 100 mg, 2 in the morning and one at night Continue phenobarbital 2 tablets at night Return to the clinic in 6 months

## 2016-07-13 NOTE — Progress Notes (Signed)
Fax confirmation received for [facility]bital, [facility] [place] [phone]

## 2016-07-21 NOTE — Progress Notes (Signed)
I have reviewed and agreed above plan. 

## 2016-12-05 ENCOUNTER — Other Ambulatory Visit: Payer: Self-pay

## 2016-12-05 ENCOUNTER — Telehealth: Payer: Self-pay | Admitting: Nurse Practitioner

## 2016-12-05 MED ORDER — PHENOBARBITAL 32.4 MG PO TABS: 64.8000 mg | ORAL_TABLET | Freq: Every day | ORAL | 0 refills | Status: DC

## 2016-12-05 NOTE — Telephone Encounter (Signed)
Patient's mother requesting refill of PHENobarbital (LUMINAL) 32.4 MG tablet called to [facility] on [address] in [place].

## 2016-12-05 NOTE — Telephone Encounter (Signed)
Phenobarbital  rx sent to [facility] on [address].

## 2017-01-11 ENCOUNTER — Encounter (INDEPENDENT_AMBULATORY_CARE_PROVIDER_SITE_OTHER): Payer: Self-pay

## 2017-01-11 ENCOUNTER — Encounter: Payer: Self-pay | Admitting: Nurse Practitioner

## 2017-01-11 ENCOUNTER — Ambulatory Visit (INDEPENDENT_AMBULATORY_CARE_PROVIDER_SITE_OTHER): Payer: Medicare Other | Admitting: Nurse Practitioner

## 2017-01-11 VITALS — BP 131/77 | HR 83 | Wt 140.2 lb

## 2017-01-11 DIAGNOSIS — Z1389 Encounter for screening for other disorder: Secondary | ICD-10-CM | POA: Insufficient documentation

## 2017-01-11 DIAGNOSIS — Z5181 Encounter for therapeutic drug level monitoring: Secondary | ICD-10-CM

## 2017-01-11 DIAGNOSIS — F79 Unspecified intellectual disabilities: Secondary | ICD-10-CM

## 2017-01-11 DIAGNOSIS — G40209 Localization-related (focal) (partial) symptomatic epilepsy and epileptic syndromes with complex partial seizures, not intractable, without status epilepticus: Secondary | ICD-10-CM | POA: Diagnosis not present

## 2017-01-11 NOTE — Progress Notes (Signed)
[facility]  PATIENT: Dana Carroll DOB: 1976/03/16   REASON FOR VISIT: Follow-up for seizure disorder, mental retardation HISTORY FROM: Patient, Mom and friend Benjie Karvonen    HISTORY OF PRESENT ILLNESS: HISTORY: She and her mother moved from [place], to be close to her family, she was previously under the care of [facility]. She has a history of seizures since age 41, also with past medical history of leukemia at 42 years old, status post chemotherapy, stroke at age 78, wasn't sure which side was affected. She also has a history of learning disability, mental retardation, but graduate from special-education of 12th grade.  Her first grand mal seizure was at age 89, she was put on antiepileptic medications, over the past 10 years, she is under the care of Dr. Tasia Catchings at [place], there is gradual titration of her Lamitrogen to current 150 + 25mg  bid, also PB 15mg  3 tabs bid, she no longer has grand mal seizure, but she has complex partial seizure. Mother reported often before her menstruation period of time, she would have 3 or 4 episodes of staring into space, confusion, lasting 1 to 2 minutes, followed by post event sleepiness, exhaution. This is at her best seizure control. She lives home with her mother, there was no family history of epilepsy. She denied lateralized motor or sensory deficit, relies on her mother to provide history  10/04/12: Patient returns for followup. She is with her boyfriend who says she has she can have 3 to 4 events during her menstral cycles and seizures when she has not slept well. Pt relies on boyfriend to answer questions. She is in school doing well at present he claims.  UPDATE March 6th 2017YY She is now living with her boyfriend, on disability, she is taking lamotrigine 150 mg in the morning, and phenobarbital 32.4 mg 2 tablets at night, last clinical visit was April 2016, she still has frequent seizure around her  menstruation, presented as staring spells, sometimes wandering around, this is the average seizure frequency at her baseline I reviewed Lab in Nov 05 2015: normal CBC, normal CMP, phenobarbital level 7.4, lamotrigine level 8.4, iron level 38, ferritin 16, Patient and her mother does want to have any medication changes at this point, mother reported patient has occasionally rash broke out on her back, and her face, suspicious her rash is due to lamotrigine, but patient has been taking lamotrigine for more than a decade UPDATE 10/12/2017CM Dana Carroll, 41 year old female returns for follow-up. She has a history of seizure disorder and mild mental retardation. She reports that she had a seizure 2 days last week. On questioning the patient how she is currently taking medication. She is not taking her  bedtime dose of Lamictal, she is only taking the phenobarbital at night. Reinstructed both the patient and friend on how to take her medication properly. She returns for reevaluation UPDATE 01/11/17 CM  Dana Carroll,  41 year old female returns for follow-up.  She has a history of seizure disorder and mild mental retardation. Mom reports that she has had some staring spells, she has not kept  a record.  On questioning she is taking 100 mg of Lamictal in the morning instead of 200 mg and 100mg  at night.   She is taking 2 phenobarbital  at night  as ordered.  She is home alone during the day. She currently does not have medication  oversight.  Appetite is good , she is sleeping well. No  falls. She returns for reevaluation.   REVIEW OF SYSTEMS: Full 14 system review of systems performed and notable only for those listed, all others are neg:  Constitutional: Fatigue  Cardiovascular: neg Ear/Nose/Throat: neg  Skin: neg Eyes: neg Respiratory: neg Gastroitestinal: neg  Hematology/Lymphatic: neg  Endocrine: neg Musculoskeletal:neg Allergy/Immunology: neg Neurological: Seizure disorder Psychiatric:  MR Sleep :  neg   ALLERGIES: No Known Allergies  HOME MEDICATIONS: Outpatient Medications Prior to Visit  Medication Sig Dispense Refill  . lamoTRIgine (LAMICTAL) 100 MG tablet 2 tabs in morning, one tab po qhs 90 tablet 11  . PHENobarbital (LUMINAL) 32.4 MG tablet Take 2 tablets (64.8 mg total) by mouth at bedtime. 90 tablet 0   No facility-administered medications prior to visit.     PAST MEDICAL HISTORY: Past Medical History:  Diagnosis Date  . Cancer ([place])   . Depression   . Seizures ([place])     PAST SURGICAL HISTORY: Past Surgical History:  Procedure Laterality Date  . BREAST SURGERY    . GALLBLADDER SURGERY      FAMILY HISTORY: Family History  Problem Relation Age of Onset  . High blood pressure Mother   . Heart failure Mother     09/2016  . Diabetes    . High blood pressure      SOCIAL HISTORY: Social History   Social History  . Marital status: Single    Spouse name: N/A  . Number of children: N/A  . Years of education: HS Grad   Occupational History  . Unemployed     Social History Main Topics  . Smoking status: Never Smoker  . Smokeless tobacco: Never Used  . Alcohol use No  . Drug use: No  . Sexual activity: Not on file   Other Topics Concern  . Not on file   Social History Narrative   Patient lives at home with boyfriend, Hilaria Ota   Patient does not work.    Patient is single.    Patient drinks caffeine rare.      PHYSICAL EXAM  Vitals:   01/11/17 1012  BP: 131/77  Pulse: 83  Weight: 140 lb 3.2 oz (63.6 kg)   Body mass index is 31.43 kg/m.  Generalized: Well developed, Obese female in no acute distress  Head: normocephalic and atraumatic,. Oropharynx benign  Neck: Supple, no carotid bruits  Cardiac: Regular rate rhythm, no murmur  Musculoskeletal: No deformity   Neurological examination   Mentation: Alert oriented to time, place, history taking. Attention span and concentration appropriate. Recent and remote memory intact.  Follows  most commands speech and language fluent.   Cranial nerve II-XII: .Pupils were equal round reactive to light extraocular movements were full, visual field were full on confrontational test. Facial sensation and strength were normal. hearing was intact to finger rubbing bilaterally. Uvula tongue midline. head turning and shoulder shrug were normal and symmetric.Tongue protrusion into cheek strength was normal. Motor: normal bulk and tone, full strength in the BUE, BLE, fine finger movements normal, no pronator drift. No focal weakness Sensory: normal and symmetric to light touch,  On the face arms and legs Coordination: finger-nose-finger, heel-to-shin bilaterally, no dysmetria Reflexes:  Symmetric upper and lower, plantar responses were flexor bilaterally. Gait and Station: Rising up from seated position without assistance, normal stance,  moderate stride, good arm swing, smooth turning, able to perform tiptoe, and heel walking without difficulty. Tandem gait is mildly unsteady  DIAGNOSTIC DATA (LABS, IMAGING, TESTING) -  ASSESSMENT AND PLAN  41 y.o.  year old female  has a past medical history of epilepsy and mild MR.  Patient has  Been having staring spells. She is not  Taking her  Lamictal as prescribe.She is taking  Her  Phenobarbital correctly.    PLAN:  Continue Lamictal 100 mg, 2 in the morning and one at night Continue phenobarbital 2 tablets at night Discussed the importance of taking the medication as prescribed, patient needs medication oversight by an adult Will check labs today CBC and CMP  To monitor adverse effects of phenobarbital send copy to PCP Will check Lamictal and PB level  To monitor for therapeutic range will refill when labs are back  Return to the clinic in 6 months, next with Dr. Krista Blue I spent 20 min  in total face to face time with the patient and MOM more than 50% of which was spent counseling and coordination of care, reviewing test results reviewing medications and  discussing and reviewing the diagnosis of seizure disorder and importance of taking  medications as prescribed. Patient due to her mild mental retardation needs to have medication oversight. This is a second time she has been to the clinic not taking meds as prescribed. Mom verbalizes understanding. Dennie Bible, [facility], [facility], APRN  [facility] [address] [place], [place] [address] [phone]

## 2017-01-11 NOTE — Patient Instructions (Signed)
Continue Lamictal 100 mg, 2 in the morning and one at night Continue phenobarbital 2 tablets at night Discussed the importance of taking the medication as prescribed Will check labs today Return to the clinic in 6 months, next with Dr. Krista Blue

## 2017-01-12 LAB — CBC WITH DIFFERENTIAL/PLATELET
BASOS: 0 %
Basophils Absolute: 0 10*3/uL (ref 0.0–0.2)
EOS (ABSOLUTE): 0.1 10*3/uL (ref 0.0–0.4)
Eos: 1 %
HEMOGLOBIN: 12.6 g/dL (ref 11.1–15.9)
Hematocrit: 35.9 % (ref 34.0–46.6)
IMMATURE GRANS (ABS): 0 10*3/uL (ref 0.0–0.1)
Immature Granulocytes: 0 %
LYMPHS ABS: 2.1 10*3/uL (ref 0.7–3.1)
LYMPHS: 31 %
MCH: 29.2 pg (ref 26.6–33.0)
MCHC: 35.1 g/dL (ref 31.5–35.7)
MCV: 83 fL (ref 79–97)
MONOCYTES: 6 %
Monocytes Absolute: 0.4 10*3/uL (ref 0.1–0.9)
NEUTROS ABS: 4.3 10*3/uL (ref 1.4–7.0)
Neutrophils: 62 %
Platelets: 286 10*3/uL (ref 150–379)
RBC: 4.32 x10E6/uL (ref 3.77–5.28)
RDW: 15 % (ref 12.3–15.4)
WBC: 6.9 10*3/uL (ref 3.4–10.8)

## 2017-01-12 LAB — COMPREHENSIVE METABOLIC PANEL
A/G RATIO: 1.3 (ref 1.2–2.2)
ALBUMIN: 3.9 g/dL (ref 3.5–5.5)
ALT: 14 IU/L (ref 0–32)
AST: 14 IU/L (ref 0–40)
Alkaline Phosphatase: 74 IU/L (ref 39–117)
BUN / CREAT RATIO: 15 (ref 9–23)
BUN: 14 mg/dL (ref 6–20)
Bilirubin Total: <0.2 mg/dL (ref 0.0–1.2)
CALCIUM: 9.1 mg/dL (ref 8.7–10.2)
CO2: 26 mmol/L (ref 18–29)
Chloride: 107 mmol/L — ABNORMAL HIGH (ref 96–106)
Creatinine, Ser: 0.94 mg/dL (ref 0.57–1.00)
GFR, EST AFRICAN AMERICAN: 88 mL/min/{1.73_m2} (ref >59)
GFR, EST NON AFRICAN AMERICAN: 77 mL/min/{1.73_m2} (ref >59)
Globulin, Total: 2.9 g/dL (ref 1.5–4.5)
Glucose: 106 mg/dL — ABNORMAL HIGH (ref 65–99)
POTASSIUM: 4.8 mmol/L (ref 3.5–5.2)
Sodium: 145 mmol/L — ABNORMAL HIGH (ref 134–144)
TOTAL PROTEIN: 6.8 g/dL (ref 6.0–8.5)

## 2017-01-12 LAB — PHENOBARBITAL LEVEL: Phenobarbital, Serum: 16 ug/mL (ref 15–40)

## 2017-01-12 LAB — LAMOTRIGINE LEVEL: Lamotrigine Lvl: 9.1 ug/mL (ref 2.0–20.0)

## 2017-01-16 ENCOUNTER — Other Ambulatory Visit: Payer: Self-pay | Admitting: Nurse Practitioner

## 2017-01-16 ENCOUNTER — Telehealth: Payer: Self-pay | Admitting: *Deleted

## 2017-01-16 MED ORDER — LAMOTRIGINE 100 MG PO TABS: ORAL_TABLET | ORAL | 11 refills | Status: DC

## 2017-01-16 MED ORDER — PHENOBARBITAL 32.4 MG PO TABS: 64.8000 mg | ORAL_TABLET | Freq: Every day | ORAL | 5 refills | Status: DC

## 2017-01-16 NOTE — Telephone Encounter (Signed)
Per Daun Peacock, NP spoke with patient's mother, [name] and informed her that patient's labs are stable. Advised she  continue giving patient Lamictal 100mg  2 tabs in the am and 1 tab in the pm. Also advised to continue with  Phenobarb 2 tabs at night time. She verbalized understanding, appreciation.

## 2017-01-17 NOTE — Progress Notes (Signed)
I have reviewed and agreed above plan. 

## 2017-01-22 DIAGNOSIS — Z6835 Body mass index (BMI) 35.0-35.9, adult: Secondary | ICD-10-CM | POA: Insufficient documentation

## 2017-01-22 DIAGNOSIS — R030 Elevated blood-pressure reading, without diagnosis of hypertension: Secondary | ICD-10-CM | POA: Insufficient documentation

## 2017-01-24 ENCOUNTER — Other Ambulatory Visit: Payer: Self-pay | Admitting: Nurse Practitioner

## 2017-01-24 ENCOUNTER — Telehealth: Payer: Self-pay | Admitting: Neurology

## 2017-01-24 MED ORDER — PHENOBARBITAL 32.4 MG PO TABS: 64.8000 mg | ORAL_TABLET | Freq: Every day | ORAL | 5 refills | Status: DC

## 2017-01-24 NOTE — Telephone Encounter (Signed)
Pt is out of refills PHENobarbital (LUMINAL) 32.4 MG tablet   Pt would still like to use  [facility], [place] AT [place] Please call pt mother

## 2017-02-26 ENCOUNTER — Other Ambulatory Visit: Payer: Self-pay | Admitting: Nurse Practitioner

## 2017-07-17 ENCOUNTER — Ambulatory Visit: Payer: Medicare Other | Admitting: Neurology

## 2017-07-23 ENCOUNTER — Other Ambulatory Visit: Payer: Self-pay | Admitting: Nurse Practitioner

## 2017-08-08 ENCOUNTER — Other Ambulatory Visit: Payer: Self-pay | Admitting: Acute Care

## 2017-08-08 DIAGNOSIS — R569 Unspecified convulsions: Secondary | ICD-10-CM

## 2017-08-15 ENCOUNTER — Ambulatory Visit
Admission: RE | Admit: 2017-08-15 | Discharge: 2017-08-15 | Disposition: A | Discharge status: Home / Self Care | Payer: Medicare Other | Source: Ambulatory Visit | Attending: Acute Care | Admitting: Acute Care

## 2017-08-15 DIAGNOSIS — R569 Unspecified convulsions: Secondary | ICD-10-CM | POA: Diagnosis not present

## 2017-08-20 ENCOUNTER — Other Ambulatory Visit: Payer: Self-pay | Admitting: Acute Care

## 2017-08-20 DIAGNOSIS — R569 Unspecified convulsions: Secondary | ICD-10-CM

## 2017-08-27 ENCOUNTER — Ambulatory Visit: Payer: Medicare Other

## 2017-09-06 ENCOUNTER — Ambulatory Visit
Admission: RE | Admit: 2017-09-06 | Discharge: 2017-09-06 | Disposition: A | Discharge status: Home / Self Care | Payer: Medicare Other | Source: Ambulatory Visit | Attending: Acute Care | Admitting: Acute Care

## 2017-09-06 DIAGNOSIS — R569 Unspecified convulsions: Secondary | ICD-10-CM

## 2017-09-06 MED ORDER — GADOBENATE DIMEGLUMINE 529 MG/ML IV SOLN
13.0000 mL | Freq: Once | INTRAVENOUS | Status: AC | PRN
  Administered 2017-09-06: 13 mL via INTRAVENOUS

## 2018-02-22 ENCOUNTER — Other Ambulatory Visit: Payer: Self-pay | Admitting: Nurse Practitioner

## 2018-04-16 IMAGING — MR MR HEAD W/O CM
9 series · 40 of 48 positions shown · non-contrast
Comparison: None.

CLINICAL DATA: Worsening seizure activity

EXAM:
MRI HEAD WITHOUT CONTRAST
TECHNIQUE: Multiplanar, multiecho pulse sequences of the brain and surrounding
structures were obtained without intravenous contrast.

[Series 2: T1 · sagittal · 5.0mm · 0.45mm/px · 2 of 25 slices shown (1 of 2)]
[im 1/25]
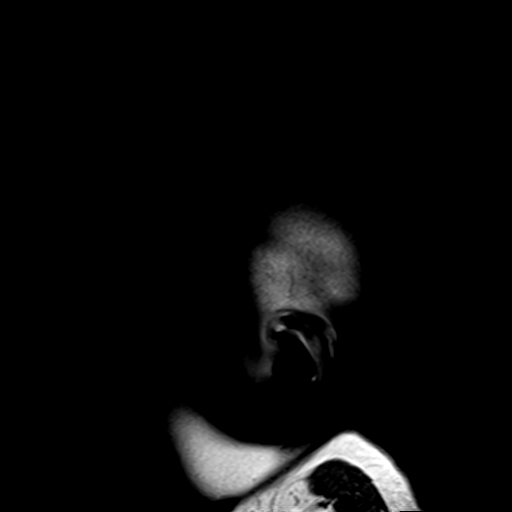
[im 25/25]
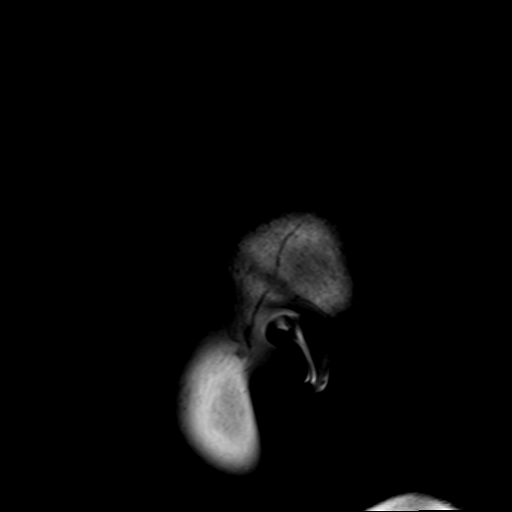

[Series 4: DWI · axial · 3.0mm · 1.80mm/px · z∈[-97,+56]mm · 5 of 41 slices shown]
[im 1/41]
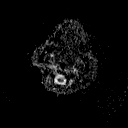
[im 11/41]
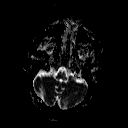
[im 21/41]
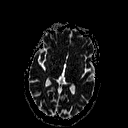
[im 31/41]
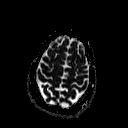
[im 41/41]
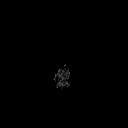

[Series 5: T2 · axial · 5.0mm · 0.60mm/px · z∈[-91,+50]mm · 3 of 23 slices shown (1 of 4)]
[im 1/23]
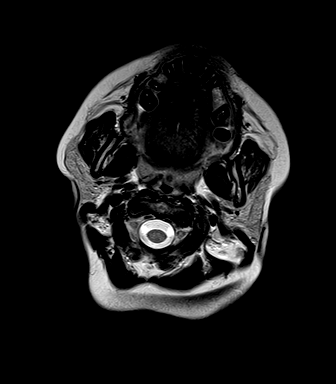
[im 12/23]
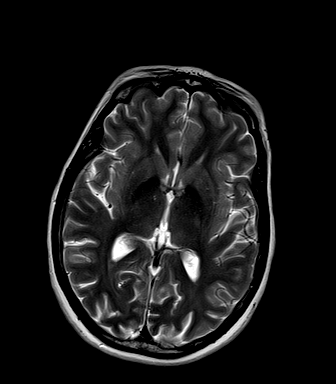
[im 23/23]
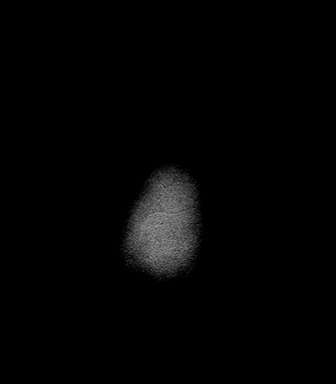

[Series 6: FLAIR · axial · 3.0mm · 0.45mm/px · z∈[-95,+58]mm · 6 of 53 slices shown]
[im 1/53]
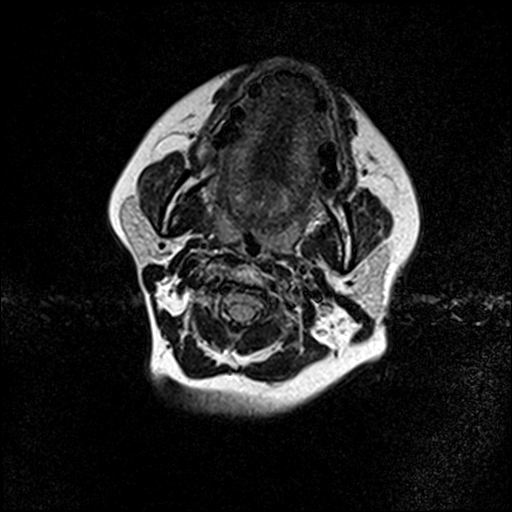
[im 11/53]
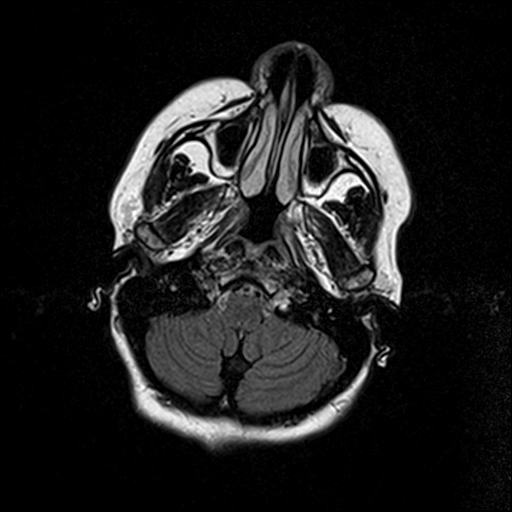
[im 21/53]
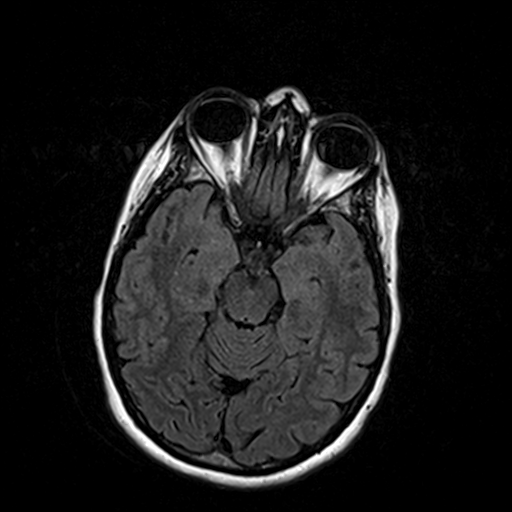
[im 32/53]
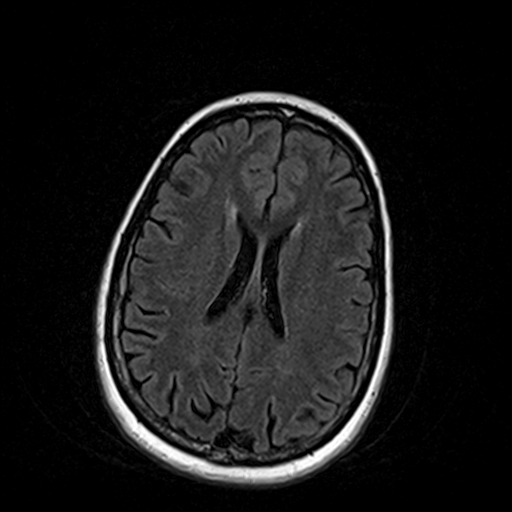
[im 42/53]
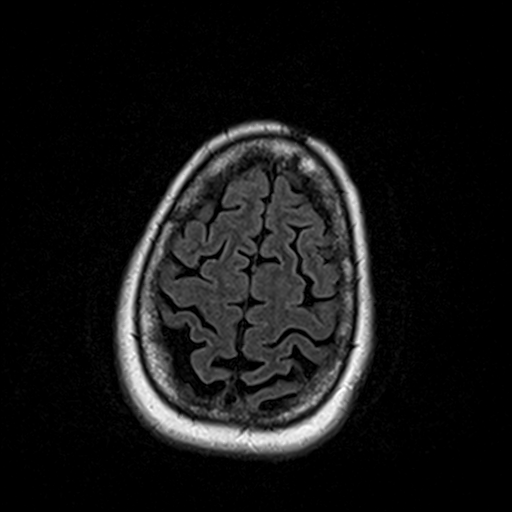
[im 53/53]
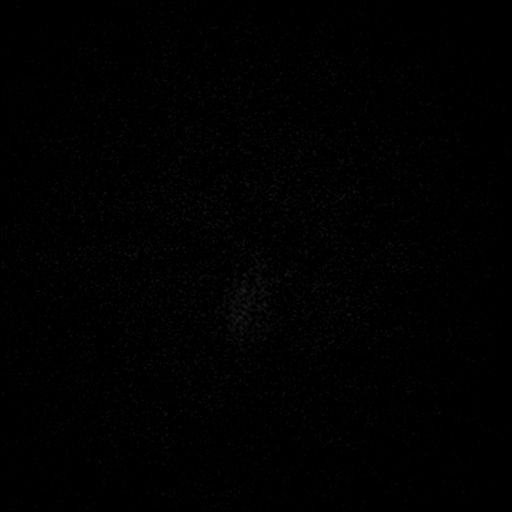

[Series 7: T2 · axial · 5.0mm · 0.45mm/px · z∈[-89,+46]mm · 3 of 22 slices shown (2 of 4)]
[im 1/22]
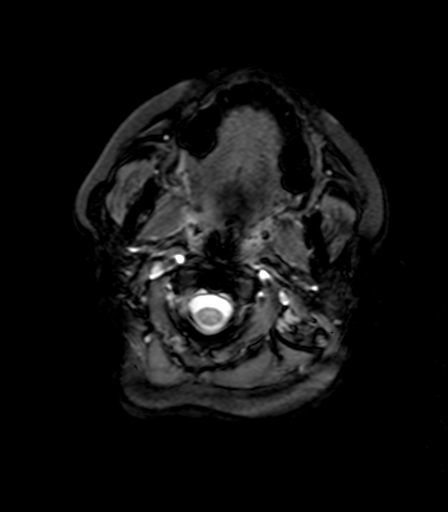
[im 11/22]
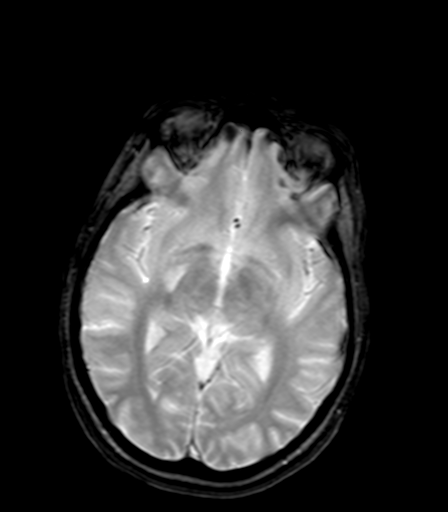
[im 22/22]
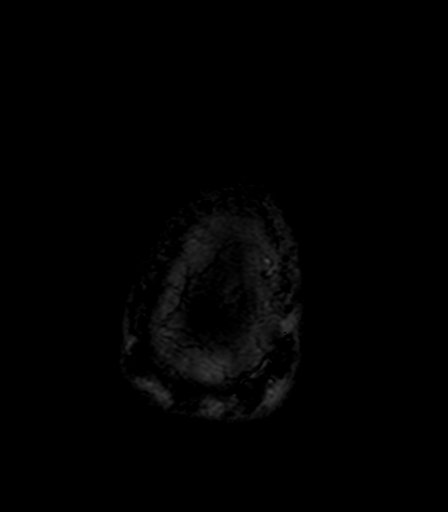

[Series 8: T1 · axial · 1.0mm · 1.00mm/px · z∈[-100,+48]mm · 11 of 160 slices shown (2 of 2)]
[im 1/160]
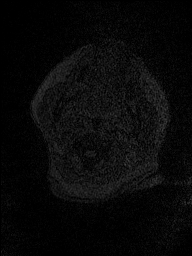
[im 9/160]
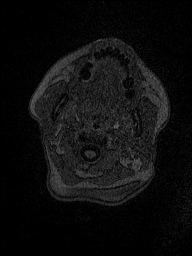
[im 18/160]
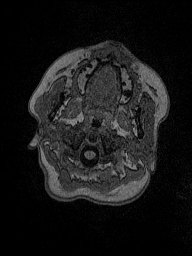
[im 27/160]
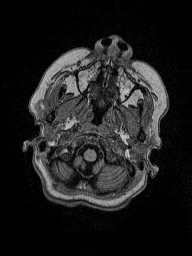
[im 36/160]
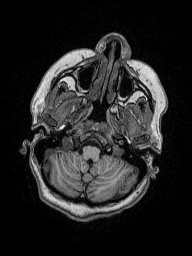
[im 45/160]
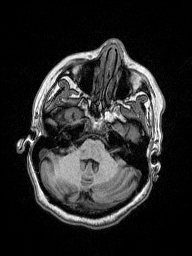
[im 71/160]
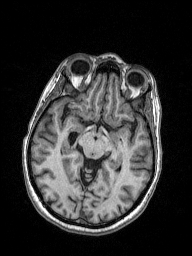
[im 89/160]
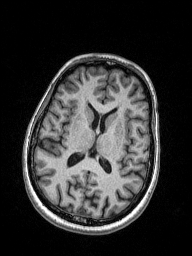
[im 115/160]
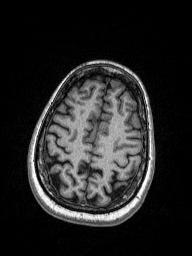
[im 133/160]
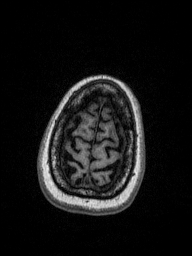
[im 151/160]
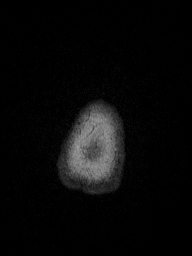

[Series 9: T2 · coronal · 3.0mm · 0.47mm/px · 3 of 22 slices shown (3 of 4)]
[im 1/22]
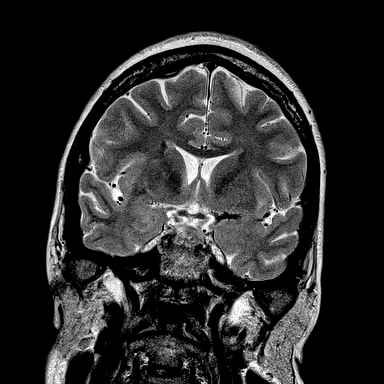
[im 11/22]
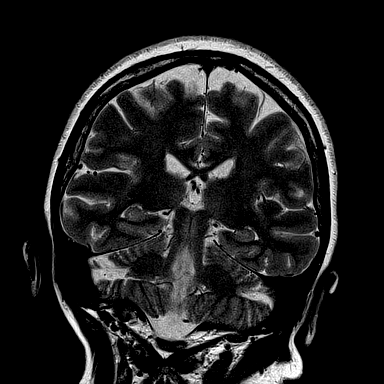
[im 22/22]
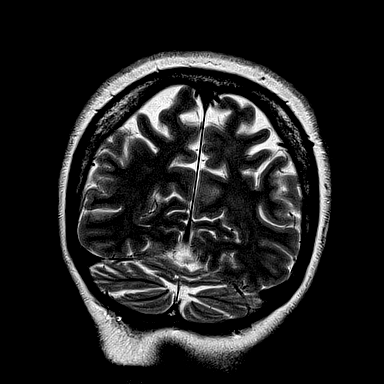

[Series 10: T2 · coronal · 5.0mm · 0.49mm/px · 3 of 27 slices shown (4 of 4)]
[im 1/27]
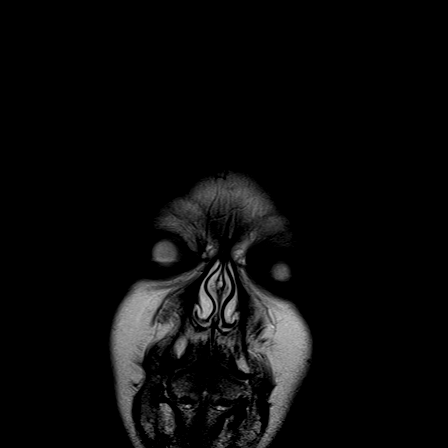
[im 14/27]
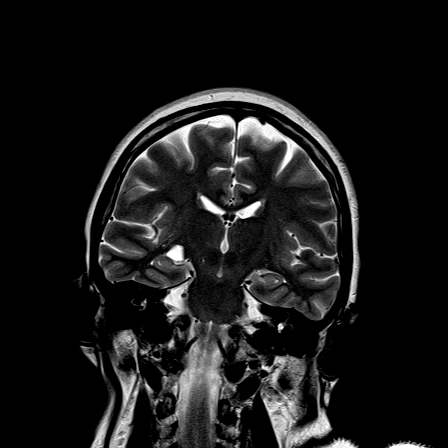
[im 27/27]
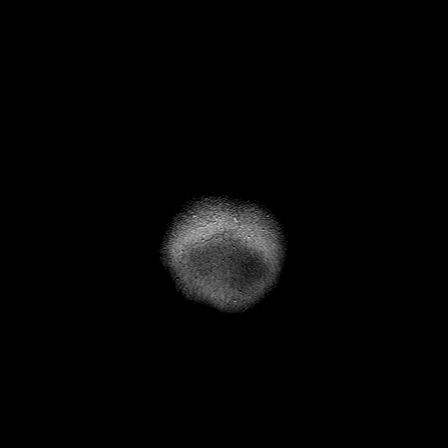

[Series 100: (id) · axial · 3.0mm · 1.80mm/px · z∈[-97,+60]mm · 4 of 36 slices shown]
[im 1/36]
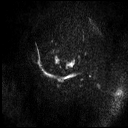
[im 12/36]
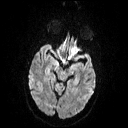
[im 24/36]
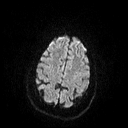
[im 36/36]
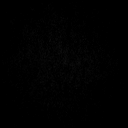

[40 of 48 positions shown; findings below may reference images not displayed]

FINDINGS: Brain: The midline structures are normal. There is no acute infarct
or acute hemorrhage. There is a cystic focus at the inferior margin
of the right lentiform nucleus, directly adjacent to the right
hippocampus. The brain parenchymal signal is normal. No age-advanced
or lobar predominant atrophy. No chronic microhemorrhage or
superficial siderosis.

Vascular: Major intracranial arterial and venous sinus flow voids
are preserved.

Skull and upper cervical spine: The visualized skull base,
calvarium, upper cervical spine and extracranial soft tissues are
normal.

Sinuses/Orbits: No fluid levels or advanced mucosal thickening. No
mastoid or middle ear effusion. Normal orbits.
IMPRESSION: 1. No acute abnormality.
2. Cystic space at the inferior aspect of the right lentiform
nucleus, in close proximity to the right hippocampus, is probably an
enlarged perivascular space or neuroepithelial cyst. In the context
of seizure, postcontrast imaging might be helpful to assess for the
somewhat unlikely possibility of an associated enhancing nodule that
would be suggestive of a neoplastic process such as a ganglioglioma.

## 2019-09-09 ENCOUNTER — Other Ambulatory Visit: Payer: Self-pay | Admitting: Family

## 2019-09-09 DIAGNOSIS — N946 Dysmenorrhea, unspecified: Secondary | ICD-10-CM

## 2019-09-18 ENCOUNTER — Ambulatory Visit
Admission: RE | Admit: 2019-09-18 | Discharge: 2019-09-18 | Disposition: A | Discharge status: Home / Self Care | Payer: Medicare Other | Source: Ambulatory Visit | Attending: Family | Admitting: Family

## 2019-09-18 DIAGNOSIS — N946 Dysmenorrhea, unspecified: Secondary | ICD-10-CM

## 2019-10-06 ENCOUNTER — Ambulatory Visit: Payer: Medicare Other | Admitting: Obstetrics and Gynecology

## 2019-10-30 ENCOUNTER — Telehealth: Payer: Medicare Other | Admitting: Neurology

## 2019-12-01 DIAGNOSIS — C9590 Leukemia, unspecified not having achieved remission: Secondary | ICD-10-CM | POA: Insufficient documentation

## 2019-12-02 ENCOUNTER — Telehealth (INDEPENDENT_AMBULATORY_CARE_PROVIDER_SITE_OTHER): Payer: Medicare Other | Admitting: Neurology

## 2019-12-02 ENCOUNTER — Encounter: Payer: Self-pay | Admitting: Neurology

## 2019-12-02 ENCOUNTER — Other Ambulatory Visit: Payer: Self-pay

## 2019-12-02 VITALS — Ht <= 58 in | Wt 153.4 lb

## 2019-12-02 DIAGNOSIS — G40209 Localization-related (focal) (partial) symptomatic epilepsy and epileptic syndromes with complex partial seizures, not intractable, without status epilepticus: Secondary | ICD-10-CM

## 2019-12-02 DIAGNOSIS — R569 Unspecified convulsions: Secondary | ICD-10-CM | POA: Diagnosis not present

## 2019-12-02 MED ORDER — PHENOBARBITAL 32.4 MG PO TABS: 64.8000 mg | ORAL_TABLET | Freq: Every day | ORAL | 3 refills | Status: DC

## 2019-12-02 MED ORDER — LAMOTRIGINE 100 MG PO TABS: ORAL_TABLET | ORAL | 3 refills | Status: DC

## 2019-12-02 MED ORDER — DIVALPROEX SODIUM 125 MG PO DR TAB: DELAYED_RELEASE_TABLET | ORAL | 3 refills | Status: DC

## 2019-12-02 NOTE — Progress Notes (Signed)
Virtual Visit via Video Note The purpose of this virtual visit is to provide medical care while limiting exposure to the novel coronavirus.    Consent was obtained for video visit:  Yes.   Answered questions that patient had about telehealth interaction:  Yes.   I discussed the limitations, risks, security and privacy concerns of performing an evaluation and management service by telemedicine. I also discussed with the patient that there may be a patient responsible charge related to this service. The patient expressed understanding and agreed to proceed.  Pt location: Home Physician Location: office Name of referring provider:  Gayland Curry, MD I connected with Carrie Mew at patients initiation/request on 12/02/2019 at  1:00 PM EST by video enabled telemedicine application and verified that I am speaking with the correct person using two identifiers. Pt MRN:  [id number] Pt DOB:  [birth year] Video Participants:  Carrie Mew;  Vonita Moss (mother)   History of Present Illness:  This is a 44 year old right-handed woman with a history of leukemia at age 44 s/p chemotherapy, stroke at age 44, and seizures since age 44, presenting to establish care. She has a history of learning disability and has been living with her mother, who administers medications. She was previously seen by neurologists Dr. Krista Blue and Dr. Melrose Nakayama, records were reviewed. She used to have GTCs, none since age 44, and mostly has focal seizures where she would be staring into space and confused. 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 Hippocampi symmetric with no abnormal signal or enhancement. EEG in 2018 reported generalized epileptiform discharges. She has been on Lamotrigine 200mg  in AM, 100mg  in PM and Phenobarbital 32.4mg  2 tabs qhs for many years. Depakote 125mg  qhs was added in 2018,  however she was lost to follow-up. Her mother reports that her current regimen "holds her," she mostly knows Estephani has a seizure when she would go straight to bed. She would be trembling then start crying, with a distressed look on her face. She goes out of the room and straight to bed. Her mother also describes automatisms in both hands, picking at her fingers, walking around from one room to another, not responding. She may giggle and laugh sometimes, lasting a few seconds up to 1 minute. When she was on Depakote 125mg  BID, it looked like she was in a coma, sleeping all day. When she was taken off Phenobarbital in the past, she had seizures and "nothing else worked for her." They feel Lamotrigine may be causing bouts where she has break outs of black spots in her face and back. When she was tried on Tegretol in her 44s, she became really violent and was put back on Pb and LTG. She reports tremors, even when eating her cereal. She had tremors even before Depakote.   She has occasional headaches. She gets tired a lot. She denies any focal numbness/tingling/weakness but her hands get tight sometimes. She fell last year when she got dizzy. She denies any olfactory/gustatory hallucinations. She has body jerks, mostly in the afternoon. Sleep is good. She has some urinary frequency.   RF: paternal cousin, maternal cousins; has had several head injuries when younger (dent in head)  Epilepsy Risk Factors:  Her paternal cousin and several maternal cousins have seizures. She had several head injuries when younger ("dent in her head"). She had a normal birth  and early development.  There is no history of febrile convulsions, CNS infections such as meningitis/encephalitis, significant traumatic brain injury, neurosurgical procedures.  Prior AEDs: Tegretol, higher dose Depakote EEGs: per Dr. Lannie Fields note "routine EEG in the awake and drowsy states is abnormal due to three 1-2 second generalized epileptiform  discharges. 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 There was a 16x15mm CSF intensity structure in the right choroid fissure without notable mass effect on the right hippocampus. Hippocampi symmetric with no abnormal signal or enhancement.   PAST MEDICAL HISTORY: Past Medical History:  Diagnosis Date  . Cancer ([place])   . Depression   . Seizures ([place])     PAST SURGICAL HISTORY: Past Surgical History:  Procedure Laterality Date  . BREAST SURGERY    . GALLBLADDER SURGERY      MEDICATIONS: Current Outpatient Medications on File Prior to Visit  Medication Sig Dispense Refill  . divalproex (DEPAKOTE) 125 MG DR tablet Take 125 mg by mouth daily.     Marland Kitchen lamoTRIgine (LAMICTAL) 100 MG tablet 2 tabs in morning, one tab po qhs 90 tablet 11  . PHENobarbital (LUMINAL) 32.4 MG tablet TAKE 2 TABLETS BY MOUTH AT BEDTIME 60 tablet 0   No current facility-administered medications on file prior to visit.    ALLERGIES: No Known Allergies  FAMILY HISTORY: Family History  Problem Relation Age of Onset  . High blood pressure Mother   . Heart failure Mother        09/2016  . Diabetes Other   . High blood pressure Other      No current outpatient medications on file prior to visit.   No current facility-administered medications on file prior to visit.     Observations/Objective:   Vitals:   12/02/19 0830  Weight: 153 lb 6.4 oz (69.6 kg)  Height: 4\' 8"  (1.422 m)   GEN:  The patient appears stated age and is in NAD.  Neurological examination: Patient is awake, alert, oriented x 3. No aphasia or dysarthria. Intact fluency and comprehension. Remote and recent memory intact. Cranial nerves: Extraocular movements intact with no nystagmus. No facial asymmetry. Motor: moves all extremities symmetrically, at least anti-gravity x 4. No incoordination on finger  to nose testing. Gait: narrow-based and steady, able to tandem walk adequately. Negative Romberg test.   Assessment and Plan:   This is a 44 year old right-handed woman with a history of leukemia at age 18 s/p chemotherapy, stroke at age 84, and seizures since age 13, presenting to establish care. 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 Semiology of seizures suggestive of focal seizures with automatisms, staring/unresponsiveness, giggling or crying, lasting up to a minute. Her mother feels seizures are overall controlled on current regimen and would like to continue on same doses of Lamotrigine 200mg  in AM, 100mg  in PM, phenobarbital 32.4mg  2 tabs qhs, and Depakote 125mg  qhs. Depakote may be contributing to tremors, continue to monitor. Safety labs with CBC, CMP, Lamotrigine, Phenobarbital, and Depakote levels will be ordered. Continue close supervision, she does not drive. No pregnancy plans. Follow-up in 6 months, they know to call for any changes.    Follow Up Instructions:   -I discussed the assessment and treatment plan with the patient/mother. The patient/mother were  provided an opportunity to ask questions and all were answered. The patient/mother agreed with the plan and demonstrated an understanding of the instructions.   The patient/mother were advised to call back or seek an in-person evaluation if the symptoms worsen or if the condition fails to improve as anticipated.    Cameron Sprang, MD

## 2019-12-03 ENCOUNTER — Other Ambulatory Visit (INDEPENDENT_AMBULATORY_CARE_PROVIDER_SITE_OTHER): Payer: Medicare Other

## 2019-12-03 DIAGNOSIS — G40209 Localization-related (focal) (partial) symptomatic epilepsy and epileptic syndromes with complex partial seizures, not intractable, without status epilepticus: Secondary | ICD-10-CM

## 2019-12-03 DIAGNOSIS — R569 Unspecified convulsions: Secondary | ICD-10-CM

## 2019-12-05 LAB — COMPREHENSIVE METABOLIC PANEL
AG Ratio: 1.2 (calc) (ref 1.0–2.5)
ALT: 11 U/L (ref 6–29)
AST: 12 U/L (ref 10–30)
Albumin: 3.7 g/dL (ref 3.6–5.1)
Alkaline phosphatase (APISO): 69 U/L (ref 31–125)
BUN: 10 mg/dL (ref 7–25)
CO2: 27 mmol/L (ref 20–32)
Calcium: 9 mg/dL (ref 8.6–10.2)
Chloride: 103 mmol/L (ref 98–110)
Creat: 0.94 mg/dL (ref 0.50–1.10)
Globulin: 3.1 g/dL (calc) (ref 1.9–3.7)
Glucose, Bld: 94 mg/dL (ref 65–99)
Potassium: 4 mmol/L (ref 3.5–5.3)
Sodium: 140 mmol/L (ref 135–146)
Total Bilirubin: 0.2 mg/dL (ref 0.2–1.2)
Total Protein: 6.8 g/dL (ref 6.1–8.1)

## 2019-12-05 LAB — CBC
HCT: 35.5 % (ref 35.0–45.0)
Hemoglobin: 12.2 g/dL (ref 11.7–15.5)
MCH: 28.3 pg (ref 27.0–33.0)
MCHC: 34.4 g/dL (ref 32.0–36.0)
MCV: 82.4 fL (ref 80.0–100.0)
MPV: 9.7 fL (ref 7.5–12.5)
Platelets: 292 10*3/uL (ref 140–400)
RBC: 4.31 10*6/uL (ref 3.80–5.10)
RDW: 15.6 % — ABNORMAL HIGH (ref 11.0–15.0)
WBC: 6.6 10*3/uL (ref 3.8–10.8)

## 2019-12-05 LAB — PHENOBARBITAL LEVEL: Phenobarbital, Serum: 17.5 mg/L (ref 15.0–40.0)

## 2019-12-05 LAB — LAMOTRIGINE LEVEL: Lamotrigine Lvl: 13.1 ug/mL (ref 4.0–18.0)

## 2019-12-05 LAB — VALPROIC ACID LEVEL: Valproic Acid Lvl: <12.5 mg/L — ABNORMAL LOW (ref 50.0–100.0)

## 2019-12-10 ENCOUNTER — Telehealth: Payer: Self-pay

## 2019-12-10 NOTE — Telephone Encounter (Signed)
Pt mother informed that the bloodwork looked good, kidneys and liver function normal. Her seizure medication levels were fine, the Depakote level was low, but it is to be expected because she is on a very low dose of it.

## 2019-12-19 DIAGNOSIS — C9101 Acute lymphoblastic leukemia, in remission: Secondary | ICD-10-CM | POA: Diagnosis not present

## 2019-12-19 DIAGNOSIS — J309 Allergic rhinitis, unspecified: Secondary | ICD-10-CM | POA: Diagnosis not present

## 2019-12-19 DIAGNOSIS — R21 Rash and other nonspecific skin eruption: Secondary | ICD-10-CM | POA: Diagnosis not present

## 2020-01-17 ENCOUNTER — Ambulatory Visit: Payer: Medicare Other | Attending: Internal Medicine

## 2020-01-17 DIAGNOSIS — Z23 Encounter for immunization: Secondary | ICD-10-CM

## 2020-01-17 NOTE — Progress Notes (Signed)
   Covid-19 Vaccination Clinic  Name:  Dana Carroll    MRN: [id number] DOB: Aug 17, 1976  01/17/2020  Dana Carroll was observed post Covid-19 immunization for 15 minutes without incident. She was provided with Vaccine Information Sheet and instruction to access the V-Safe system.   Dana Carroll was instructed to call 911 with any severe reactions post vaccine: Marland Kitchen Difficulty breathing  . Swelling of face and throat  . A fast heartbeat  . A bad rash all over body  . Dizziness and weakness   Immunizations Administered    Name Date Dose VIS Date Route   Pfizer COVID-19 Vaccine 01/17/2020  1:36 PM 0.3 mL 09/12/2019 Intramuscular   Manufacturer: [place]   Lot: [id number]   [place]: [id number]

## 2020-02-11 ENCOUNTER — Ambulatory Visit: Payer: Medicare Other | Attending: Internal Medicine

## 2020-02-11 DIAGNOSIS — Z23 Encounter for immunization: Secondary | ICD-10-CM

## 2020-02-11 NOTE — Progress Notes (Signed)
   Covid-19 Vaccination Clinic  Name:  Dana Carroll    MRN: [id number] DOB: Nov 11, 1975  02/11/2020  [name] was observed post Covid-19 immunization for 15 minutes without incident. She was provided with Vaccine Information Sheet and instruction to access the V-Safe system.   [name] was instructed to call 911 with any severe reactions post vaccine: Marland Kitchen Difficulty breathing  . Swelling of face and throat  . A fast heartbeat  . A bad rash all over body  . Dizziness and weakness   Immunizations Administered    Name Date Dose VIS Date Route   Pfizer COVID-19 Vaccine 02/11/2020  8:37 AM 0.3 mL 11/26/2018 Intramuscular   Manufacturer: [place]   Lot: [id number]   [place]: [id number]

## 2020-07-16 ENCOUNTER — Other Ambulatory Visit: Payer: Self-pay

## 2020-07-16 ENCOUNTER — Ambulatory Visit (INDEPENDENT_AMBULATORY_CARE_PROVIDER_SITE_OTHER): Payer: Medicare Other | Admitting: Neurology

## 2020-07-16 ENCOUNTER — Encounter: Payer: Self-pay | Admitting: Neurology

## 2020-07-16 VITALS — BP 105/77 | HR 86 | Ht <= 58 in | Wt 163.7 lb

## 2020-07-16 DIAGNOSIS — G40209 Localization-related (focal) (partial) symptomatic epilepsy and epileptic syndromes with complex partial seizures, not intractable, without status epilepticus: Secondary | ICD-10-CM | POA: Diagnosis not present

## 2020-07-16 MED ORDER — LAMOTRIGINE 100 MG PO TABS: ORAL_TABLET | ORAL | 3 refills | Status: DC

## 2020-07-16 MED ORDER — DIVALPROEX SODIUM 125 MG PO DR TAB: DELAYED_RELEASE_TABLET | ORAL | 3 refills | Status: DC

## 2020-07-16 MED ORDER — PHENOBARBITAL 32.4 MG PO TABS: 64.8000 mg | ORAL_TABLET | Freq: Every day | ORAL | 3 refills | Status: DC

## 2020-07-16 NOTE — Progress Notes (Signed)
NEUROLOGY FOLLOW UP OFFICE NOTE  Dana Carroll [id number] December 08, 1975  HISTORY OF PRESENT ILLNESS: I had the pleasure of seeing Dana Carroll in follow-up in the neurology clinic on 07/16/2020.  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 She is again accompanied by her mother who helps supplement the history today.  Records and images were personally reviewed where available.  She is on Depakote 125mg  qhs, Lamotrigine 200mg  in AM, 100mg  in PM, and Phenobarbital 32.4mg  qhs. AED levels done in 12/2019 showed Pb level of 17.5, LTG level of 13.1, and VPA level <12.5 (she is on a very low dose). CBC and CMP normal. They reports she is overall doing well. She tends to have brief seizures around the time of her period (once a month). She has occasional staring spells, no myoclonic jerks or convulsive activity. She denies any focal numbness/tingling/weakness. She has headaches after doing online classes. She has occasional dizziness, no diplopia. Sleep is good. She had a fall with no injuries, fall was not seizure-related. She manages her own medications.    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 She has a history of learning disability and has been living with her mother, who administers medications. She was previously seen by neurologists Dr. Krista Carroll and Dr. Melrose Carroll, records were reviewed. She used to have GTCs, none since age 74, and mostly has focal seizures where she would be staring into space and confused. 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 Hippocampi symmetric with no abnormal signal or enhancement. EEG in 2018 reported generalized epileptiform discharges. She has been on  Lamotrigine 200mg  in AM, 100mg  in PM and Phenobarbital 32.4mg  2 tabs qhs for many years. Depakote 125mg  qhs was added in 2018, however she was lost to follow-up. Her mother reports that her current regimen "holds her," she mostly knows [name] has a seizure when she would go straight to bed. She would be trembling then start crying, with a distressed look on her face. She goes out of the room and straight to bed. Her mother also describes automatisms in both hands, picking at her fingers, walking around from one room to another, not responding. She may giggle and laugh sometimes, lasting a few seconds up to 1 minute. When she was on Depakote 125mg  BID, it looked like she was in a coma, sleeping all day. When she was taken off Phenobarbital in the past, she had seizures and "nothing else worked for her." They feel Lamotrigine may be causing bouts where she has break outs of black spots in her face and back. When she was tried on Tegretol in her 49s, she became really violent and was put back on Pb and LTG. She reports tremors, even when eating her cereal. She had tremors even before Depakote.   She has occasional headaches. She gets tired a lot. She denies any focal numbness/tingling/weakness but her hands get tight sometimes. She fell last year when she got dizzy. She denies any olfactory/gustatory hallucinations. She has body jerks, mostly in the afternoon. Sleep is good. She has some urinary frequency.   Epilepsy Risk Factors:  Her paternal cousin and several maternal cousins have seizures. She had several head injuries  when younger ("dent in her head"). She had a normal birth and early development.  There is no history of febrile convulsions, CNS infections such as meningitis/encephalitis, significant traumatic brain injury, neurosurgical procedures.  Prior AEDs: Tegretol, higher dose Depakote  EEGs: per Dr. Lannie Carroll note "routine EEG in the awake and drowsy states is abnormal due to three 1-2 second  generalized epileptiform discharges. 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 There was a 16x4mm CSF intensity structure in the right choroid fissure without notable mass effect on the right hippocampus. Hippocampi symmetric with no abnormal signal or enhancement.   PAST MEDICAL HISTORY: Past Medical History:  Diagnosis Date  . Cancer ([place])   . Depression   . Seizures ([place])     MEDICATIONS: Current Outpatient Medications on File Prior to Visit  Medication Sig Dispense Refill  . Cholecalciferol (VITAMIN D3) 1.25 MG (50000 UT) CAPS Take by mouth.    . divalproex (DEPAKOTE) 125 MG DR tablet Take 1 tablet daily 90 tablet 3  . lamoTRIgine (LAMICTAL) 100 MG tablet Take 2 tablets in the morning, 1 tablet at night 270 tablet 3  . MAGNESIUM PO Take 400 mg by mouth daily.    Marland Kitchen PHENobarbital (LUMINAL) 32.4 MG tablet Take 2 tablets (64.8 mg total) by mouth at bedtime. 180 tablet 3  . vitamin B-12 (CYANOCOBALAMIN) 100 MCG tablet Take 100 mcg by mouth daily.    . vitamin C (ASCORBIC ACID) 250 MG tablet Take 250 mg by mouth daily.     No current facility-administered medications on file prior to visit.    ALLERGIES: No Known Allergies  FAMILY HISTORY: Family History  Problem Relation Age of Onset  . High blood pressure Mother   . Heart failure Mother        09/2016  . Diabetes Other   . High blood pressure Other     SOCIAL HISTORY: Social History   Socioeconomic History  . Marital status: Single    Spouse name: Not on file  . Number of children: Not on file  . Years of education: HS Grad  . Highest education level: Not on file  Occupational History  . Occupation: Unemployed   Tobacco Use  . Smoking status: Never Smoker  . Smokeless tobacco: Never Used  Vaping Use  . Vaping Use: Never used  Substance and Sexual Activity  . Alcohol use: No    . Drug use: No  . Sexual activity: Not on file  Other Topics Concern  . Not on file  Social History Narrative   Patient lives at home with boyfriend, Hilaria Ota   Patient does not work.    Patient is single.    Patient drinks caffeine rare.    Social Determinants of Health   Financial Resource Strain:   . Difficulty of Paying Living Expenses: Not on file  Food Insecurity:   . Worried About Charity fundraiser in the Last Year: Not on file  . Ran Out of Food in the Last Year: Not on file  Transportation Needs:   . Lack of Transportation (Medical): Not on file  . Lack of Transportation (Non-Medical): Not on file  Physical Activity:   . Days of Exercise per Week: Not on file  . Minutes of Exercise per Session: Not on file  Stress:   . Feeling of Stress : Not on file  Social Connections:   .  Frequency of Communication with Friends and Family: Not on file  . Frequency of Social Gatherings with Friends and Family: Not on file  . Attends Religious Services: Not on file  . Active Member of Clubs or Organizations: Not on file  . Attends Archivist Meetings: Not on file  . Marital Status: Not on file  Intimate Partner Violence:   . Fear of Current or Ex-Partner: Not on file  . Emotionally Abused: Not on file  . Physically Abused: Not on file  . Sexually Abused: Not on file     PHYSICAL EXAM: Vitals:   07/16/20 1534  BP: 105/77  Pulse: 86  SpO2: 97%   General: No acute distress Head:  Normocephalic/atraumatic Skin/Extremities: No rash, no edema Neurological Exam: alert and awake. No aphasia or dysarthria. Fund of knowledge is appropriate.  Recent and remote memory are intact.  Attention and concentration are normal.   Cranial nerves: Pupils equal, round. Extraocular movements intact with no nystagmus. Visual Carroll full.  No facial asymmetry.  Motor: Bulk and tone normal, muscle strength 5/5 throughout with no pronator drift.   Finger to nose testing intact.  Gait  narrow-based and steady, able to tandem walk adequately.  Romberg negative.   IMPRESSION: This is a 44 yo RH woman with a history of leukemia at age 26 s/p chemotherapy, stroke at age 41, and seizures since age 71, with EEG suggestive of primary generalized epilepsy. 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 Semiology of seizures suggestive of focal seizures with automatisms, staring/unresponsiveness, giggling or crying, lasting up to a minute. Patient and family satisfied with current seizure control (catamenial seizures), refills sent for Lamotrigine 200mg  in AM, 100mg  in PM, phenobarbital 32.4mg  2 tabs qhs, and Depakote 125mg  qhs. No significant tremors today. Continue to monitor vitamin D levels and bone health with PCP. She does not drive. No pregnancy plans. Follow-up in 8 months, they know to call for any changes.    Thank you for allowing me to participate in her care.  Please do not hesitate to call for any questions or concerns.   Dana Carroll, M.D.   CC: Dana Folks, FNP

## 2020-07-16 NOTE — Patient Instructions (Signed)
Good to see you. Refills for all your medications were sent to your pharmacy. Follow-up in 8 months, call for any changes.  Seizure Precautions: 1. If medication has been prescribed for you to prevent seizures, take it exactly as directed.  Do not stop taking the medicine without talking to your doctor first, even if you have not had a seizure in a long time.   2. Avoid activities in which a seizure would cause danger to yourself or to others.  Don't operate dangerous machinery, swim alone, or climb in high or dangerous places, such as on ladders, roofs, or girders.  Do not drive unless your doctor says you may.  3. If you have any warning that you may have a seizure, lay down in a safe place where you can't hurt yourself.    4.  No driving for 6 months from last seizure, as per [facility].   Please refer to the following link on the [place] website for more information: http://[email or web address]/[email or web address]/drivingu.cfm   5.  Maintain good sleep hygiene. Avoid alcohol   6.  Notify your neurology if you are planning pregnancy or if you become pregnant.  7.  Contact your doctor if you have any problems that may be related to the medicine you are taking.  8.  Call 911 and bring the patient back to the ED if:        A.  The seizure lasts longer than 5 minutes.       B.  The patient doesn't awaken shortly after the seizure  C.  The patient has new problems such as difficulty seeing, speaking or moving  D.  The patient was injured during the seizure  E.  The patient has a temperature over 102 F (39C)  F.  The patient vomited and now is having trouble breathing

## 2020-10-07 ENCOUNTER — Other Ambulatory Visit: Payer: Medicare Other

## 2020-10-07 ENCOUNTER — Other Ambulatory Visit: Payer: Self-pay

## 2020-10-07 DIAGNOSIS — Z20822 Contact with and (suspected) exposure to covid-19: Secondary | ICD-10-CM

## 2020-10-10 ENCOUNTER — Telehealth: Payer: Self-pay

## 2020-10-10 NOTE — Telephone Encounter (Signed)
Advised pt's mother that results are not back for covid 19. MyChart sign up link sent via email.

## 2020-10-11 ENCOUNTER — Ambulatory Visit: Payer: Self-pay | Admitting: *Deleted

## 2020-10-11 LAB — NOVEL CORONAVIRUS, NAA: SARS-CoV-2, NAA: DETECTED — AB

## 2020-10-11 NOTE — Telephone Encounter (Signed)
Patient mother called and  notified of positive COVID-19 test results. Pt mother  verbalized understanding. Pt mother reports symptoms of covid are getting better  Criteria for self-isolation:  -Please quarantine and isolate at home  for at least 10 days since symptoms started AND - At least 24 hours fever free without the use of fever reducing medications such as Tylenol or Ibuprofen AND - Improvement in respiratory symptoms Use over-the-counter medications for symptoms.If you develop respiratory issues/distress, seek medical care in the Emergency Department.  If you must leave home or if you have to be around others please wear a mask. Please limit contact with immediate family members in the home, practice social distancing, frequent handwashing and clean hard surfaces touched frequently with household cleaning products. Members of your household will also need to quarantine and test. Pt mother  informed that the health department will likely follow up and may have additional recommendations. Will notify [facility].

## 2020-10-12 ENCOUNTER — Telehealth: Payer: Self-pay | Admitting: *Deleted

## 2020-10-13 NOTE — Telephone Encounter (Signed)
No answer. Left VM to return call.

## 2021-01-08 ENCOUNTER — Other Ambulatory Visit: Payer: Self-pay | Admitting: Neurology

## 2021-02-08 ENCOUNTER — Other Ambulatory Visit: Payer: Self-pay

## 2021-02-08 ENCOUNTER — Encounter: Payer: Self-pay | Admitting: Adult Health

## 2021-02-08 ENCOUNTER — Ambulatory Visit (INDEPENDENT_AMBULATORY_CARE_PROVIDER_SITE_OTHER): Payer: Medicare Other | Admitting: Adult Health

## 2021-02-08 VITALS — BP 146/90 | HR 89 | Temp 97.9°F | Ht <= 58 in | Wt 157.6 lb

## 2021-02-08 DIAGNOSIS — Z6835 Body mass index (BMI) 35.0-35.9, adult: Secondary | ICD-10-CM | POA: Diagnosis not present

## 2021-02-08 DIAGNOSIS — R5383 Other fatigue: Secondary | ICD-10-CM | POA: Diagnosis not present

## 2021-02-08 DIAGNOSIS — Z79899 Other long term (current) drug therapy: Secondary | ICD-10-CM

## 2021-02-08 DIAGNOSIS — R21 Rash and other nonspecific skin eruption: Secondary | ICD-10-CM

## 2021-02-08 DIAGNOSIS — F819 Developmental disorder of scholastic skills, unspecified: Secondary | ICD-10-CM

## 2021-02-08 DIAGNOSIS — B360 Pityriasis versicolor: Secondary | ICD-10-CM

## 2021-02-08 DIAGNOSIS — G40209 Localization-related (focal) (partial) symptomatic epilepsy and epileptic syndromes with complex partial seizures, not intractable, without status epilepticus: Secondary | ICD-10-CM | POA: Diagnosis not present

## 2021-02-08 MED ORDER — KETOCONAZOLE 2 % EX SHAM: 1.0000 "application " | MEDICATED_SHAMPOO | CUTANEOUS | 1 refills | Status: DC

## 2021-02-08 MED ORDER — NYSTATIN 100000 UNIT/GM EX OINT: 1.0000 "application " | TOPICAL_OINTMENT | Freq: Two times a day (BID) | CUTANEOUS | 1 refills | Status: DC

## 2021-02-08 NOTE — Progress Notes (Signed)
New Patient Office Visit  Subjective:  Patient ID: Dana Carroll, female    DOB: 01-02-1976  Age: 45 y.o. MRN: [id number]  CC:  Chief Complaint  Patient presents with  . Follow-up    Pt previous PCP was Dustin Folks in [facility] in [place] Dr. Delice Lesch for neurology history of seizures. Last office visit 07/16/2020 to follow up in 8 months.  HPI Dana Carroll presents for establish care, and she is accompanied by her mother. She has a learning disability lives with mother.   She needs refill on triamcinolone acetonide for intermittent rash.  History of leukemia as a child still in remission.  They recently moved to [facility] and mother reports the drive to [facility] neurology is too far and gas is to high to go to [employer]. She would like referral back to [place] neurologist.   Denies any worsening symptoms or any concerns other than an itchy rash on back and around ears. She has only absent seizures now with her medication.   Patient  denies any fever, body aches,chills, rash, chest pain, shortness of breath, nausea, vomiting, or diarrhea.  Denies dizziness, lightheadedness, pre syncopal or syncopal episodes.    Past Medical History:  Diagnosis Date  . Cancer ([place])   . Depression   . Seizures ([place])     Past Surgical History:  Procedure Laterality Date  . BREAST SURGERY    . GALLBLADDER SURGERY      Family History  Problem Relation Age of Onset  . High blood pressure Mother   . Heart failure Mother        09/2016  . Cancer Father   . Diabetes Other   . High blood pressure Other     Social History   Socioeconomic History  . Marital status: Single    Spouse name: Not on file  . Number of children: Not on file  . Years of education: HS Grad  . Highest education level: Not on file  Occupational History  . Occupation: Unemployed   Tobacco Use  . Smoking status: Never Smoker  . Smokeless tobacco: Never Used  Vaping Use  . Vaping  Use: Never used  Substance and Sexual Activity  . Alcohol use: No  . Drug use: No  . Sexual activity: Not on file  Other Topics Concern  . Not on file  Social History Narrative   Patient lives at home with boyfriend, Hilaria Ota   Patient does not work.    Patient is single.    Patient drinks caffeine rare.    Social Determinants of Health   Financial Resource Strain: Not on file  Food Insecurity: Not on file  Transportation Needs: Not on file  Physical Activity: Not on file  Stress: Not on file  Social Connections: Not on file  Intimate Partner Violence: Not on file    ROS Review of Systems  Constitutional: Positive for fatigue.  HENT: Negative.   Eyes: Negative.   Respiratory: Negative.   Cardiovascular: Negative.   Gastrointestinal: Negative.   Endocrine: Negative.   Musculoskeletal: Negative.   Skin: Positive for color change and rash.  Neurological: Negative.   Hematological: Negative.   Psychiatric/Behavioral: Negative.     Objective:   Today's Vitals: BP (!) 146/90 (BP Location: Left Arm, Patient Position: Sitting)   Pulse 89   Temp 97.9 F (36.6 C)   Ht 4' 7.91" (1.42 m)   Wt 157 lb 9.6 oz (71.5 kg)  LMP  (LMP Unknown)   SpO2 97%   BMI 35.45 kg/m   Physical Exam Vitals reviewed.  Constitutional:      Appearance: She is obese. She is not diaphoretic.     Comments: Short stature   HENT:     Head: Normocephalic and atraumatic.     Right Ear: Tympanic membrane, ear canal and external ear normal. There is no impacted cerumen.     Left Ear: Tympanic membrane, ear canal and external ear normal. There is no impacted cerumen.     Nose: Nose normal. No congestion or rhinorrhea.     Mouth/Throat:     Mouth: Mucous membranes are moist.     Pharynx: Oropharynx is clear. No oropharyngeal exudate or posterior oropharyngeal erythema.  Eyes:     General: No scleral icterus.       Right eye: No discharge.        Left eye: No discharge.     Extraocular  Movements: Extraocular movements intact.     Conjunctiva/sclera: Conjunctivae normal.     Pupils: Pupils are equal, round, and reactive to light.  Neck:     Vascular: No carotid bruit.  Cardiovascular:     Rate and Rhythm: Normal rate and regular rhythm.     Pulses: Normal pulses.     Heart sounds: Normal heart sounds. No murmur heard. No friction rub. No gallop.   Pulmonary:     Effort: Pulmonary effort is normal. No respiratory distress.     Breath sounds: Normal breath sounds. No stridor. No wheezing, rhonchi or rales.  Chest:     Chest wall: No tenderness.  Abdominal:     General: Bowel sounds are normal. There is no distension.     Palpations: Abdomen is soft. There is no mass.     Tenderness: There is no abdominal tenderness. There is no right CVA tenderness, left CVA tenderness, guarding or rebound.     Hernia: No hernia is present.  Musculoskeletal:        General: Normal range of motion.     Cervical back: Normal range of motion and neck supple. No rigidity or tenderness.  Lymphadenopathy:     Cervical: No cervical adenopathy.  Skin:    General: Skin is warm.     Findings: Rash present. No abscess, ecchymosis or erythema. Rash is macular. Rash is not crusting.     Nails: There is no clubbing.          Comments: Hyperpigmented patches of skin on back   Neurological:     Mental Status: She is alert. Mental status is at baseline.     Motor: No weakness.     Gait: Gait normal.  Psychiatric:        Mood and Affect: Mood normal.        Behavior: Behavior normal.        Thought Content: Thought content normal.        Judgment: Judgment normal.     Assessment & Plan:   Problem List Items Addressed This Visit      Nervous and Auditory   Seizure disorder, complex partial ([place]) - Primary   Relevant Orders   Ambulatory referral to Neurology     Musculoskeletal and Integument   Tinea versicolor   Relevant Medications   nystatin ointment (MYCOSTATIN)   ketoconazole  (NIZORAL) 2 % shampoo (Start on 02/10/2021)     Other   Learning disability   Body mass index (BMI) of 35.0-35.9 in  adult   High risk medication use   Relevant Orders   Lamotrigine level   Phenobarbital level   Valproic Acid level   Ambulatory referral to Neurology   Fatigue   Relevant Orders   CBC with Differential/Platelet   Comprehensive metabolic panel   TSH   Lipid panel   Ambulatory referral to Neurology     Meds ordered this encounter  Medications  . nystatin ointment (MYCOSTATIN)    Sig: Apply 1 application topically 2 (two) times daily.    Dispense:  30 g    Refill:  1  . ketoconazole (NIZORAL) 2 % shampoo    Sig: Apply 1 application topically 2 (two) times a week.    Dispense:  120 mL    Refill:  1   Advised with ketoconazole to use as lathered with water on area of concern and leave on 5- 10 minutes and rinse thoroughly.   Nystatin for small area that itches in front of ear, no obvious rash, mildly flat hyperpigmented skin, getting worse with triamcinolone use present x 3 months per mom.  Orders Placed This Encounter  Procedures  . CBC with Differential/Platelet  . Comprehensive metabolic panel  . TSH  . Lipid panel  . Lamotrigine level  . Phenobarbital level  . Valproic Acid level  . Ambulatory referral to Neurology   referral to neurology in [place] due to distance to [facility] office.   Outpatient Encounter Medications as of 02/08/2021  Medication Sig  . Cholecalciferol (VITAMIN D3) 1.25 MG (50000 UT) CAPS Take by mouth.  . divalproex (DEPAKOTE) 125 MG DR tablet TAKE 1 TABLET BY MOUTH DAILY  . [START ON 02/10/2021] ketoconazole (NIZORAL) 2 % shampoo Apply 1 application topically 2 (two) times a week.  . lamoTRIgine (LAMICTAL) 100 MG tablet TAKE 2 TABLETS BY MOUTH IN THE MORNING AND 1 TABLET AT NIGHT  . MAGNESIUM PO Take 400 mg by mouth daily.  Marland Kitchen nystatin ointment (MYCOSTATIN) Apply 1 application topically 2 (two) times daily.  Marland Kitchen PHENobarbital  (LUMINAL) 32.4 MG tablet Take 2 tablets (64.8 mg total) by mouth at bedtime.  . vitamin B-12 (CYANOCOBALAMIN) 100 MCG tablet Take 100 mcg by mouth daily.  . vitamin C (ASCORBIC ACID) 250 MG tablet Take 250 mg by mouth daily.   No facility-administered encounter medications on file as of 02/08/2021.   Return for complete physical when due.   The patient is advised to begin progressive daily aerobic exercise program, attempt to lose weight, reduce exposure to stress, improve dietary compliance, continue current medications, continue current healthy lifestyle patterns and return for routine annual checkups  Neurology to refill seizure medications,will check levels today mom reports had plenty of medicine from previous neurologist and that they will refill until she is in with [place] neurology.  Return precautions given. Red Flags discussed. The patient was given clear instructions to go to ER or return to medical center if any red flags develop, symptoms do not improve, worsen or new problems develop. They verbalized understanding.    Risks, benefits, and alternatives of the medications and treatment plan prescribed today were discussed, and patient expressed understanding.    Education regarding symptom management and diagnosis given to patient on AVS.  Patient was in agreement with treatment plan.   Continue to follow with  Kelby Aline. Lyndall Windt AGNP-C, FNP-C for routine health maintenance.   Kelby Aline. Marialena Wollen AGNP-C, FNP-C   Follow-up: Return in about 3 months (around 05/11/2021), or if symptoms worsen or fail to improve,  for at any time for any worsening symptoms, Go to Emergency room/ urgent care if worse.   Marcille Buffy, FNP

## 2021-02-08 NOTE — Patient Instructions (Signed)
Lather on and leave on 5- 10 minutes then rinse off Ketoconazole shampoo What is this medicine? KETOCONAZOLE (kee toe KON na zole) is an antifungal medicine. It is used on the hair and scalp. It treats head and scalp infections caused by fungus. This medicine may be used for other purposes; ask your health care provider or pharmacist if you have questions. COMMON BRAND NAME(S): Nizoral, Nizoral A-D What should I tell my health care provider before I take this medicine? They need to know if you have any of these conditions:  large areas of burned or damaged skin  an unusual or allergic reaction to ketoconazole, other medicines, foods, dyes, or preservatives  pregnant or trying to get pregnant  breast-feeding How should I use this medicine? This medicine is for external use only. Do not take by mouth. Wash hands before and after use. Do not get this medicine in your eyes. If you do, rinse them out with plenty of cool tap water. Use this medicine as directed on the prescription label at the same time every day. Do not use it more often than directed. Use the medicine for the full course as directed by your health care provider, even if you think you are better. Do not stop using it unless your health care provider tells you to stop it early. Wet the skin and apply the medicine to the areas to be cleansed. Massage gently into the skin working it into a full lather. Leave in place for 5 minutes. Rinse thoroughly with plain water. Pat the skin dry. If you are using this product on your scalp, you do not need to use regular shampoo after every application. Talk to your health care provider about the use of this medicine in children. Special care may be needed. Overdosage: If you think you have taken too much of this medicine contact a poison control center or emergency room at once. NOTE: This medicine is only for you. Do not share this medicine with others. What if I miss a dose? If you miss a  dose, use it as soon as you can. If it is almost time for your next dose, use only that dose. Do not use double or extra doses. What may interact with this medicine? Interactions are not expected. Do not use any other skin products without telling your doctor or health care professional. This list may not describe all possible interactions. Give your health care provider a list of all the medicines, herbs, non-prescription drugs, or dietary supplements you use. Also tell them if you smoke, drink alcohol, or use illegal drugs. Some items may interact with your medicine. What should I watch for while using this medicine? Visit your health care provider for regular checks on your progress. Tell your health care provider if your symptoms do not start to get better or if they get worse. If your hair has been permanently waved, this medicine may remove the curls from your hair. What side effects may I notice from receiving this medicine? Side effects that you should report to your doctor or health care provider as soon as possible:  allergic reactions (skin rash, itching or hives; swelling of the face, lips, or tongue)  burning, crusting, itching, stinging, swelling, tenderness of skin Side effects that usually do not require medical attention (report to your doctor or health care provider if they continue or are bothersome):  change in hair texture or color  hair loss  mild skin irritation, redness, or dryness This  list may not describe all possible side effects. Call your doctor for medical advice about side effects. You may report side effects to FDA at 1-800-FDA-1088. Where should I keep my medicine? Keep out of the reach of children and pets. Store at room temperature between 20 and 25 degrees C (68 and 77 degrees F). Protect from light. Get rid of any unused medicine after the expiration date. NOTE: This sheet is a summary. It may not cover all possible information. If you have questions  about this medicine, talk to your doctor, pharmacist, or health care provider.  2021 Elsevier/Gold Standard (2019-10-02 11:18:15) Nystatin skin cream or ointment What is this medicine? NYSTATIN (nye STAT in) is an antifungal medicine. It is used on the skin. It treats skin infections caused by fungus. This medicine may be used for other purposes; ask your health care provider or pharmacist if you have questions. COMMON BRAND NAME(S): Mycostatin, Nystex, Pediaderm AF What should I tell my health care provider before I take this medicine? They need to know if you have any of these conditions:  large areas of burned or damaged skin  an unusual or allergic reaction to nystatin, other medicines, foods, dyes, or preservatives  pregnant or trying to get pregnant  breast-feeding How should I use this medicine? This medicine is for external use only. Do not take by mouth. Wash your hands before and after use. If you are treating a hand infection, only wash your hands before use. Do not get it in your eyes. If you do, rinse your eyes with plenty of cool tap water. Use it as directed on the prescription label at the same time every day. Do not use it more often than directed. Use the medicine for the full course as directed by your health care provider, even if you think you are better. Do not stop using it unless your health care provider tells you to stop it early. Apply a thin film of the medicine to the affected area. You can cover the area with a sterile gauze dressing (bandage). Do not use an airtight bandage (such as a plastic-covered bandage). Talk to your health care provider about the use of this medicine in children. Special care may be needed. Overdosage: If you think you have taken too much of this medicine contact a poison control center or emergency room at once. NOTE: This medicine is only for you. Do not share this medicine with others. What if I miss a dose? If you miss a dose, use it  as soon as you can. If it is almost time for your next dose, use only that dose. Do not use double or extra doses. What may interact with this medicine? Interactions are not expected. Do not use any other skin products on the affected area without telling your doctor or health care professional. This list may not describe all possible interactions. Give your health care provider a list of all the medicines, herbs, non-prescription drugs, or dietary supplements you use. Also tell them if you smoke, drink alcohol, or use illegal drugs. Some items may interact with your medicine. What should I watch for while using this medicine? Visit your health care provider for regular checks on your progress. It may be some time before you see the benefit from this medicine. After bathing, make sure your skin is very dry. Fungal infections like moist conditions. Do not walk around barefoot. To help prevent reinfection, wear freshly washed cotton, not synthetic, clothing. Tell your health  care provider if you develop sores or blisters that do not heal properly. If your skin infection returns after you stop using this medicine, contact your health care provider. If you are using this medicine for jock itch, do not wear underwear that is tight-fitting or made from synthetic fibers such as rayon or nylon. Instead, wear loose-fitting, cotton underwear. Dry the area completely after bathing. If you are using this medicine to treat athlete's foot, carefully dry the feet, especially between the toes after bathing. Do not wear socks made from wool or synthetic materials such as rayon or nylon. Wear clean cotton socks and change them at least once a day. Wear sandals or shoes that are well-ventilated. An absorbent powder, such as talcum powder, may be used to keep the skin dry. Apply the powder to the affected skin in between applications of this medicine. What side effects may I notice from receiving this medicine? Side effects  that you should report to your doctor or health care provider as soon as possible:  allergic reactions (skin rash, itching or hives; swelling of the face, lips, or tongue)  burning, crusting, itching, stinging, swelling, tenderness Side effects that usually do not require medical attention (report to your doctor or health care provider if they continue or are bothersome):  mild skin irritation, redness, or dryness This list may not describe all possible side effects. Call your doctor for medical advice about side effects. You may report side effects to FDA at 1-800-FDA-1088. Where should I keep my medicine? Keep out of the reach of children and pets. Store at room temperature between 20 and 25 degrees C (68 and 77 degrees F). Avoid exposure to extreme heat. Do not freeze. Get rid of any unused medicine after the expiration date. NOTE: This sheet is a summary. It may not cover all possible information. If you have questions about this medicine, talk to your doctor, pharmacist, or health care provider.  2021 Elsevier/Gold Standard (2019-10-09 07:39:42) Health Maintenance, Female Adopting a healthy lifestyle and getting preventive care are important in promoting health and wellness. Ask your health care provider about:  The right schedule for you to have regular tests and exams.  Things you can do on your own to prevent diseases and keep yourself healthy. What should I know about diet, weight, and exercise? Eat a healthy diet  Eat a diet that includes plenty of vegetables, fruits, low-fat dairy products, and lean protein.  Do not eat a lot of foods that are high in solid fats, added sugars, or sodium.   Maintain a healthy weight Body mass index (BMI) is used to identify weight problems. It estimates body fat based on height and weight. Your health care provider can help determine your BMI and help you achieve or maintain a healthy weight. Get regular exercise Get regular exercise. This is  one of the most important things you can do for your health. Most adults should:  Exercise for at least 150 minutes each week. The exercise should increase your heart rate and make you sweat (moderate-intensity exercise).  Do strengthening exercises at least twice a week. This is in addition to the moderate-intensity exercise.  Spend less time sitting. Even light physical activity can be beneficial. Watch cholesterol and blood lipids Have your blood tested for lipids and cholesterol at 43 years of age, then have this test every 5 years. Have your cholesterol levels checked more often if:  Your lipid or cholesterol levels are high.  You are older than  43 years of age.  You are at high risk for heart disease. What should I know about cancer screening? Depending on your health history and family history, you may need to have cancer screening at various ages. This may include screening for:  Breast cancer.  Cervical cancer.  Colorectal cancer.  Skin cancer.  Lung cancer. What should I know about heart disease, diabetes, and high blood pressure? Blood pressure and heart disease  High blood pressure causes heart disease and increases the risk of stroke. This is more likely to develop in people who have high blood pressure readings, are of African descent, or are overweight.  Have your blood pressure checked: ? Every 3-5 years if you are 31-10 years of age. ? Every year if you are 33 years old or older. Diabetes Have regular diabetes screenings. This checks your fasting blood sugar level. Have the screening done:  Once every three years after age 43 if you are at a normal weight and have a low risk for diabetes.  More often and at a younger age if you are overweight or have a high risk for diabetes. What should I know about preventing infection? Hepatitis B If you have a higher risk for hepatitis B, you should be screened for this virus. Talk with your health care provider to  find out if you are at risk for hepatitis B infection. Hepatitis C Testing is recommended for:  Everyone born from [birth year] through 1963.  Anyone with known risk factors for hepatitis C. Sexually transmitted infections (STIs)  Get screened for STIs, including gonorrhea and chlamydia, if: ? You are sexually active and are younger than 43 years of age. ? You are older than 43 years of age and your health care provider tells you that you are at risk for this type of infection. ? Your sexual activity has changed since you were last screened, and you are at increased risk for chlamydia or gonorrhea. Ask your health care provider if you are at risk.  Ask your health care provider about whether you are at high risk for HIV. Your health care provider may recommend a prescription medicine to help prevent HIV infection. If you choose to take medicine to prevent HIV, you should first get tested for HIV. You should then be tested every 3 months for as long as you are taking the medicine. Pregnancy  If you are about to stop having your period (premenopausal) and you may become pregnant, seek counseling before you get pregnant.  Take 400 to 800 micrograms (mcg) of folic acid every day if you become pregnant.  Ask for birth control (contraception) if you want to prevent pregnancy. Osteoporosis and menopause Osteoporosis is a disease in which the bones lose minerals and strength with aging. This can result in bone fractures. If you are 57 years old or older, or if you are at risk for osteoporosis and fractures, ask your health care provider if you should:  Be screened for bone loss.  Take a calcium or vitamin D supplement to lower your risk of fractures.  Be given hormone replacement therapy (HRT) to treat symptoms of menopause. Follow these instructions at home: Lifestyle  Do not use any products that contain nicotine or tobacco, such as cigarettes, e-cigarettes, and chewing tobacco. If you need help  quitting, ask your health care provider.  Do not use street drugs.  Do not share needles.  Ask your health care provider for help if you need support or information about quitting  drugs. Alcohol use  Do not drink alcohol if: ? Your health care provider tells you not to drink. ? You are pregnant, may be pregnant, or are planning to become pregnant.  If you drink alcohol: ? Limit how much you use to 0-1 drink a day. ? Limit intake if you are breastfeeding.  Be aware of how much alcohol is in your drink. In the [place] one drink equals one 12 oz bottle of beer (355 mL), one 5 oz glass of wine (148 mL), or one 1 oz glass of hard liquor (44 mL). General instructions  Schedule regular health, dental, and eye exams.  Stay current with your vaccines.  Tell your health care provider if: ? You often feel depressed. ? You have ever been abused or do not feel safe at home. Summary  Adopting a healthy lifestyle and getting preventive care are important in promoting health and wellness.  Follow your health care provider's instructions about healthy diet, exercising, and getting tested or screened for diseases.  Follow your health care provider's instructions on monitoring your cholesterol and blood pressure. This information is not intended to replace advice given to you by your health care provider. Make sure you discuss any questions you have with your health care provider. Document Revised: 09/11/2018 Document Reviewed: 09/11/2018 Elsevier Patient Education  2021 [employer].

## 2021-02-09 ENCOUNTER — Encounter: Payer: Self-pay | Admitting: Adult Health

## 2021-02-09 DIAGNOSIS — R5383 Other fatigue: Secondary | ICD-10-CM | POA: Insufficient documentation

## 2021-02-09 DIAGNOSIS — Z79899 Other long term (current) drug therapy: Secondary | ICD-10-CM | POA: Insufficient documentation

## 2021-02-09 DIAGNOSIS — B360 Pityriasis versicolor: Secondary | ICD-10-CM | POA: Insufficient documentation

## 2021-02-14 ENCOUNTER — Other Ambulatory Visit: Payer: Self-pay | Admitting: Neurology

## 2021-02-23 ENCOUNTER — Other Ambulatory Visit: Payer: Self-pay | Admitting: Neurology

## 2021-02-24 ENCOUNTER — Other Ambulatory Visit: Payer: Self-pay

## 2021-02-24 ENCOUNTER — Telehealth: Payer: Self-pay | Admitting: Neurology

## 2021-02-24 MED ORDER — PHENOBARBITAL 32.4 MG PO TABS: ORAL_TABLET | ORAL | 0 refills | Status: DC

## 2021-02-24 NOTE — Telephone Encounter (Signed)
Refill sent back in

## 2021-02-24 NOTE — Telephone Encounter (Signed)
Patient's mother called in wanting to find out why the refill of phenobarbital was denied.

## 2021-03-24 ENCOUNTER — Telehealth: Payer: Self-pay | Admitting: Adult Health

## 2021-03-24 DIAGNOSIS — H6120 Impacted cerumen, unspecified ear: Secondary | ICD-10-CM

## 2021-03-24 NOTE — Telephone Encounter (Signed)
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  Pts mother states they clean her ears out and it keeps getting blocked up. Pts mother states this has been happening for a couple of years and she wants the referral to ENT to see if they can help.

## 2021-03-24 NOTE — Telephone Encounter (Signed)
Pt mother called they are requesting and ENT referral. Pt mother just stated that she is having a lot of ear problems

## 2021-03-25 ENCOUNTER — Ambulatory Visit: Payer: Medicare Other | Admitting: Neurology

## 2021-03-25 NOTE — Telephone Encounter (Signed)
Tried to place the referral and its needing a specific doctor in order for me to place it.

## 2021-03-28 NOTE — Telephone Encounter (Signed)
Pt's mother aware.

## 2021-03-28 NOTE — Telephone Encounter (Signed)
Call pt Referral to ent placed Let us know if you dont hear back within a week in regards to an appointment being scheduled.

## 2021-03-28 NOTE — Telephone Encounter (Signed)
Can you place referral for this patient? I do not have access to do that.

## 2021-03-28 NOTE — Addendum Note (Signed)
Addended by: Burnard Hawthorne on: 03/28/2021 01:24 PM   Modules accepted: Orders

## 2021-05-10 ENCOUNTER — Other Ambulatory Visit: Payer: Medicare Other

## 2021-05-16 ENCOUNTER — Ambulatory Visit: Payer: Medicare Other | Admitting: Adult Health

## 2021-05-19 ENCOUNTER — Encounter: Payer: Self-pay | Admitting: Adult Health

## 2021-06-20 ENCOUNTER — Other Ambulatory Visit: Payer: Medicare Other

## 2021-06-22 ENCOUNTER — Ambulatory Visit: Payer: Medicare Other | Admitting: Adult Health

## 2021-07-12 ENCOUNTER — Other Ambulatory Visit: Payer: Medicare Other

## 2021-07-18 ENCOUNTER — Ambulatory Visit: Payer: Medicare Other | Admitting: Adult Health

## 2021-08-26 ENCOUNTER — Ambulatory Visit: Payer: Medicare Other | Admitting: Nurse Practitioner

## 2021-09-06 ENCOUNTER — Encounter: Payer: Self-pay | Admitting: Adult Health

## 2021-09-06 ENCOUNTER — Ambulatory Visit (INDEPENDENT_AMBULATORY_CARE_PROVIDER_SITE_OTHER): Payer: Medicare Other | Admitting: Adult Health

## 2021-09-06 ENCOUNTER — Other Ambulatory Visit: Payer: Self-pay

## 2021-09-06 VITALS — BP 130/82 | HR 72 | Temp 95.7°F | Ht <= 58 in | Wt 162.2 lb

## 2021-09-06 DIAGNOSIS — Z5181 Encounter for therapeutic drug level monitoring: Secondary | ICD-10-CM

## 2021-09-06 DIAGNOSIS — Z23 Encounter for immunization: Secondary | ICD-10-CM

## 2021-09-06 DIAGNOSIS — R5383 Other fatigue: Secondary | ICD-10-CM

## 2021-09-06 DIAGNOSIS — B360 Pityriasis versicolor: Secondary | ICD-10-CM

## 2021-09-06 DIAGNOSIS — F819 Developmental disorder of scholastic skills, unspecified: Secondary | ICD-10-CM

## 2021-09-06 DIAGNOSIS — E559 Vitamin D deficiency, unspecified: Secondary | ICD-10-CM | POA: Diagnosis not present

## 2021-09-06 DIAGNOSIS — Z79899 Other long term (current) drug therapy: Secondary | ICD-10-CM

## 2021-09-06 DIAGNOSIS — G40209 Localization-related (focal) (partial) symptomatic epilepsy and epileptic syndromes with complex partial seizures, not intractable, without status epilepticus: Secondary | ICD-10-CM | POA: Diagnosis not present

## 2021-09-06 LAB — CBC WITH DIFFERENTIAL/PLATELET
Basophils Absolute: 0 10*3/uL (ref 0.0–0.1)
Basophils Relative: 0.2 % (ref 0.0–3.0)
Eosinophils Absolute: 0.1 10*3/uL (ref 0.0–0.7)
Eosinophils Relative: 1.7 % (ref 0.0–5.0)
HCT: 38.2 % (ref 36.0–46.0)
Hemoglobin: 12.5 g/dL (ref 12.0–15.0)
Lymphocytes Relative: 31.9 % (ref 12.0–46.0)
Lymphs Abs: 2.8 10*3/uL (ref 0.7–4.0)
MCHC: 32.7 g/dL (ref 30.0–36.0)
MCV: 85.5 fl (ref 78.0–100.0)
Monocytes Absolute: 0.7 10*3/uL (ref 0.1–1.0)
Monocytes Relative: 7.5 % (ref 3.0–12.0)
Neutro Abs: 5.2 10*3/uL (ref 1.4–7.7)
Neutrophils Relative %: 58.7 % (ref 43.0–77.0)
Platelets: 287 10*3/uL (ref 150.0–400.0)
RBC: 4.46 Mil/uL (ref 3.87–5.11)
RDW: 16.4 % — ABNORMAL HIGH (ref 11.5–15.5)
WBC: 8.9 10*3/uL (ref 4.0–10.5)

## 2021-09-06 LAB — COMPREHENSIVE METABOLIC PANEL
ALT: 16 U/L (ref 0–35)
AST: 16 U/L (ref 0–37)
Albumin: 4 g/dL (ref 3.5–5.2)
Alkaline Phosphatase: 61 U/L (ref 39–117)
BUN: 10 mg/dL (ref 6–23)
CO2: 27 mEq/L (ref 19–32)
Calcium: 9 mg/dL (ref 8.4–10.5)
Chloride: 104 mEq/L (ref 96–112)
Creatinine, Ser: 0.92 mg/dL (ref 0.40–1.20)
GFR: 76.14 mL/min (ref >60.00)
Glucose, Bld: 92 mg/dL (ref 70–99)
Potassium: 3.8 mEq/L (ref 3.5–5.1)
Sodium: 139 mEq/L (ref 135–145)
Total Bilirubin: 0.3 mg/dL (ref 0.2–1.2)
Total Protein: 6.8 g/dL (ref 6.0–8.3)

## 2021-09-06 LAB — LIPID PANEL
Cholesterol: 164 mg/dL (ref 0–200)
HDL: 41.9 mg/dL (ref >39.00)
LDL Cholesterol: 90 mg/dL (ref 0–99)
NonHDL: 122.09
Total CHOL/HDL Ratio: 4
Triglycerides: 160 mg/dL — ABNORMAL HIGH (ref 0.0–149.0)
VLDL: 32 mg/dL (ref 0.0–40.0)

## 2021-09-06 LAB — VITAMIN D 25 HYDROXY (VIT D DEFICIENCY, FRACTURES): VITD: 40.36 ng/mL (ref 30.00–100.00)

## 2021-09-06 LAB — TSH: TSH: 3.09 u[IU]/mL (ref 0.35–5.50)

## 2021-09-06 MED ORDER — VITAMIN D (ERGOCALCIFEROL) 1.25 MG (50000 UNIT) PO CAPS: 50000.0000 [IU] | ORAL_CAPSULE | ORAL | 0 refills | Status: DC

## 2021-09-06 NOTE — Progress Notes (Signed)
Acute Office Visit  Subjective:    Patient ID: Dana Carroll, female    DOB: 09-02-1976, 45 y.o.   MRN: [id number]  Chief Complaint  Patient presents with   Follow-up    HPI Patient is in today for refill on vitamin D she has been on 2,000 international units and wants refill.  Is accompanied by her mother.  Patient feels well today she has no concerns or needs to address other than vitamin D refill.  Does need labs today.  Has some chronic fatigue with antiseizure medications this is unchanged.  Denies any new or changing worsening symptoms.  Denies any dizziness, presyncopal or syncopal episodes. Neurology switching phenobarbital to clonazepam.   Patient  denies any fever, body aches,chills, rash, chest pain, shortness of breath, nausea, vomiting, or diarrhea.   Mother reports still sees gynecology ( unknown who) reports PAP was done and normal.  Past Medical History:  Diagnosis Date   Cancer ([place])    Depression    Seizures ([place])     Past Surgical History:  Procedure Laterality Date   BREAST SURGERY     GALLBLADDER SURGERY      Family History  Problem Relation Age of Onset   High blood pressure Mother    Heart failure Mother        09/2016   Cancer Father    Diabetes Other    High blood pressure Other     Social History   Socioeconomic History   Marital status: Single    Spouse name: Not on file   Number of children: Not on file   Years of education: HS Grad   Highest education level: Not on file  Occupational History   Occupation: Unemployed   Tobacco Use   Smoking status: Never   Smokeless tobacco: Never  Vaping Use   Vaping Use: Never used  Substance and Sexual Activity   Alcohol use: No   Drug use: No   Sexual activity: Not on file  Other Topics Concern   Not on file  Social History Narrative   Patient lives at home with boyfriend, Hilaria Ota   Patient does not work.    Patient is single.    Patient drinks caffeine rare.    Social  Determinants of Health   Financial Resource Strain: Not on file  Food Insecurity: Not on file  Transportation Needs: Not on file  Physical Activity: Not on file  Stress: Not on file  Social Connections: Not on file  Intimate Partner Violence: Not on file    Outpatient Medications Prior to Visit  Medication Sig Dispense Refill   divalproex (DEPAKOTE) 125 MG DR tablet TAKE 1 TABLET BY MOUTH DAILY 90 tablet 0   ketoconazole (NIZORAL) 2 % shampoo Apply 1 application topically 2 (two) times a week. 120 mL 1   lamoTRIgine (LAMICTAL) 100 MG tablet TAKE 2 TABLETS BY MOUTH IN THE MORNING AND 1 TABLET AT NIGHT 270 tablet 0   MAGNESIUM PO Take 400 mg by mouth daily.     nystatin ointment (MYCOSTATIN) Apply 1 application topically 2 (two) times daily. 30 g 1   PHENobarbital (LUMINAL) 32.4 MG tablet TAKE 2 TABLETS(64.8 MG) BY MOUTH AT BEDTIME 180 tablet 0   vitamin B-12 (CYANOCOBALAMIN) 100 MCG tablet Take 100 mcg by mouth daily.     vitamin C (ASCORBIC ACID) 250 MG tablet Take 250 mg by mouth daily.     Cholecalciferol (VITAMIN D3) 1.25 MG (50000 UT) CAPS Take by mouth.  No facility-administered medications prior to visit.    No Known Allergies  Review of Systems  Constitutional:  Positive for fatigue.  HENT: Negative.    Respiratory: Negative.    Cardiovascular: Negative.   Gastrointestinal: Negative.   Genitourinary: Negative.   Musculoskeletal: Negative.   Neurological: Negative.   Hematological: Negative.   Psychiatric/Behavioral: Negative.        Objective:    Physical Exam Vitals reviewed.  Constitutional:      General: She is not in acute distress.    Appearance: She is well-developed. She is not diaphoretic.     Interventions: She is not intubated. HENT:     Head: Normocephalic and atraumatic.     Right Ear: External ear normal.     Left Ear: External ear normal.     Nose: Nose normal.     Mouth/Throat:     Pharynx: No oropharyngeal exudate.  Eyes:     General:  Lids are normal. No scleral icterus.       Right eye: No discharge.        Left eye: No discharge.     Conjunctiva/sclera: Conjunctivae normal.     Right eye: Right conjunctiva is not injected. No exudate or hemorrhage.    Left eye: Left conjunctiva is not injected. No exudate or hemorrhage.    Pupils: Pupils are equal, round, and reactive to light.  Neck:     Thyroid: No thyroid mass or thyromegaly.     Vascular: Normal carotid pulses. No carotid bruit, hepatojugular reflux or JVD.     Trachea: Trachea and phonation normal. No tracheal tenderness or tracheal deviation.     Meningeal: Brudzinski's sign and Kernig's sign absent.  Cardiovascular:     Rate and Rhythm: Normal rate and regular rhythm.     Pulses: Normal pulses.          Radial pulses are 2+ on the right side and 2+ on the left side.       Dorsalis pedis pulses are 2+ on the right side and 2+ on the left side.       Posterior tibial pulses are 2+ on the right side and 2+ on the left side.     Heart sounds: Normal heart sounds, S1 normal and S2 normal. Heart sounds not distant. No murmur heard.   No friction rub. No gallop.  Pulmonary:     Effort: Pulmonary effort is normal. No tachypnea, bradypnea, accessory muscle usage or respiratory distress. She is not intubated.     Breath sounds: Normal breath sounds. No stridor. No wheezing or rales.  Chest:     Chest wall: No tenderness.  Abdominal:     General: Bowel sounds are normal. There is no distension or abdominal bruit.     Palpations: Abdomen is soft. There is no shifting dullness, fluid wave, hepatomegaly, splenomegaly, mass or pulsatile mass.     Tenderness: There is no abdominal tenderness. There is no guarding or rebound.     Hernia: No hernia is present.  Musculoskeletal:        General: No tenderness or deformity. Normal range of motion.     Cervical back: Full passive range of motion without pain, normal range of motion and neck supple. No edema, erythema or  rigidity. No spinous process tenderness or muscular tenderness. Normal range of motion.  Lymphadenopathy:     Head:     Right side of head: No submental, submandibular, tonsillar, preauricular, posterior auricular or occipital adenopathy.  Left side of head: No submental, submandibular, tonsillar, preauricular, posterior auricular or occipital adenopathy.     Cervical: No cervical adenopathy.     Right cervical: No superficial, deep or posterior cervical adenopathy.    Left cervical: No superficial, deep or posterior cervical adenopathy.     Upper Body:     Right upper body: No supraclavicular or pectoral adenopathy.     Left upper body: No supraclavicular or pectoral adenopathy.  Skin:    General: Skin is warm and dry.     Coloration: Skin is not pale.     Findings: No abrasion, bruising, burn, ecchymosis, erythema, lesion, petechiae or rash.     Nails: There is no clubbing.  Neurological:     Mental Status: She is alert and oriented to person, place, and time.     GCS: GCS eye subscore is 4. GCS verbal subscore is 5. GCS motor subscore is 6.     Cranial Nerves: No cranial nerve deficit.     Sensory: No sensory deficit.     Motor: No tremor, atrophy, abnormal muscle tone or seizure activity.     Coordination: Coordination normal.     Gait: Gait normal.     Deep Tendon Reflexes: Reflexes are normal and symmetric. Reflexes normal. Babinski sign absent on the right side. Babinski sign absent on the left side.     Reflex Scores:      Tricep reflexes are 2+ on the right side and 2+ on the left side.      Bicep reflexes are 2+ on the right side and 2+ on the left side.      Brachioradialis reflexes are 2+ on the right side and 2+ on the left side.      Patellar reflexes are 2+ on the right side and 2+ on the left side.      Achilles reflexes are 2+ on the right side and 2+ on the left side. Psychiatric:        Speech: Speech normal.        Behavior: Behavior normal.        Thought  Content: Thought content normal.        Judgment: Judgment normal.     Comments: Mild mental delay.  Unchanged    BP 130/82 (BP Location: Right Arm, Patient Position: Sitting, Cuff Size: Normal)   Pulse 72   Temp (!) 95.7 F (35.4 C) (Temporal)   Ht 4' 8.25" (1.429 m)   Wt 162 lb 3.2 oz (73.6 kg)   SpO2 98%   BMI 36.04 kg/m  Wt Readings from Last 3 Encounters:  09/06/21 162 lb 3.2 oz (73.6 kg)  02/08/21 157 lb 9.6 oz (71.5 kg)  07/16/20 163 lb 11.2 oz (74.3 kg)    Health Maintenance Due  Topic Date Due   Pneumococcal Vaccine 48-70 Years old (1 - PCV) Never done   HIV Screening  Never done   Hepatitis C Screening  Never done   PAP SMEAR-Modifier  Never done    There are no preventive care reminders to display for this patient.   No results found for: TSH Lab Results  Component Value Date   WBC 6.6 12/03/2019   HGB 12.2 12/03/2019   HCT 35.5 12/03/2019   MCV 82.4 12/03/2019   PLT 292 12/03/2019   Lab Results  Component Value Date   NA 140 12/03/2019   K 4.0 12/03/2019   CO2 27 12/03/2019   GLUCOSE 94 12/03/2019   BUN 10  12/03/2019   CREATININE 0.94 12/03/2019   BILITOT 0.2 12/03/2019   ALKPHOS 74 01/11/2017   AST 12 12/03/2019   ALT 11 12/03/2019   PROT 6.8 12/03/2019   ALBUMIN 3.9 01/11/2017   CALCIUM 9.0 12/03/2019   ANIONGAP 5 (L) 08/17/2012   No results found for: CHOL No results found for: HDL No results found for: LDLCALC No results found for: TRIG No results found for: CHOLHDL No results found for: HGBA1C     Assessment & Plan:   Problem List Items Addressed This Visit       Nervous and Auditory   Seizure disorder, 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 Potter in  neurology. He is changing her from phenobarbitol to clonazepam, mother has not started yet is waiting until after christmas, advised to inform Dr. Melrose Nakayama.   Tinea versicolor resolving with ketoconazole shampoo, she does not need refill at this time.   Refill vitamin D , needs vitamin D lab as well as labs not completed and ordered on 02/08/21.  Meds ordered this encounter  Medications   Vitamin D, Ergocalciferol, (DRISDOL) 1.25 MG (50000 UNIT) CAPS capsule    Sig: Take 1 capsule (50,000 Units total) by mouth every 7 (seven) days. (taking one tablet per week)    Dispense:  12 capsule    Refill:  0   Red Flags discussed. The patient was given clear instructions to go to ER or return to medical center if any red flags develop, symptoms do not improve, worsen or new problems develop. They verbalized understanding.  Return in about 3 months (around 12/05/2021), or if symptoms worsen or fail to improve, for at any time for any worsening symptoms, Go to Emergency room/ urgent care if worse.   Marcille Buffy, FNP

## 2021-09-06 NOTE — Progress Notes (Signed)
CMP within normal.CBC ok.  Vitamin D is within normal limits. Can continue script vitamin d and recheck in 3 months.  TSH within normal limits.  Total cholesterol and LDL elevated.  Discuss lifestyle modification with patient e.g. increase exercise, fiber, fruits, vegetables, lean meat, and omega 3/fish intake and decrease saturated fat.  If patient following strict diet and exercise program already please schedule follow up appointment with primary care physician

## 2021-09-06 NOTE — Progress Notes (Signed)
CMP within normal.CBC ok.  Vitamin D is within normal limits. Can continue script vitamin d and recheck in 3 months.  TSH within normal limits. Triglycerides.  Discuss lifestyle modification with patient e.g. increase exercise, fiber, fruits, vegetables, lean meat, and omega 3/fish intake and decrease saturated fat.  If patient following strict diet and exercise program already please schedule follow up appointment with primary care physician  Correction LDL and Total cholesterol within normal limits.

## 2021-09-06 NOTE — Patient Instructions (Signed)
Vitamin D Deficiency Vitamin D deficiency is when your body does not have enough vitamin D. Vitamin D is important to your body for many reasons: It helps the body absorb two important minerals--calcium and phosphorus. It plays a role in bone health. It may help to prevent some diseases, such as diabetes and multiple sclerosis. It plays a role in muscle function, including heart function. If vitamin D deficiency is severe, it can cause a condition in which your bones become soft. In adults, this condition is called osteomalacia. In children, this condition is called rickets. What are the causes? This condition may be caused by: Not eating enough foods that contain vitamin D. Not getting enough natural sun exposure. Having certain digestive system diseases that make it difficult for your body to absorb vitamin D. These diseases include Crohn's disease, chronic pancreatitis, and cystic fibrosis. Having a surgery in which a part of the stomach or a part of the small intestine is removed. Having chronic kidney disease or liver disease. What increases the risk? You are more likely to develop this condition if you: Are older. Do not spend much time outdoors. Live in a long-term care facility. Have had broken bones. Have weak or thin bones (osteoporosis). Have a disease or condition that changes how the body absorbs vitamin D. Have dark skin. Take certain medicines, such as steroid medicines or certain seizure medicines. Are overweight or obese. What are the signs or symptoms? In mild cases of vitamin D deficiency, there may not be any symptoms. If the condition is severe, symptoms may include: Bone pain. Muscle pain. Falling often. Broken bones caused by a minor injury. How is this diagnosed? This condition may be diagnosed with blood tests. Imaging tests such as X-rays may also be done to look for changes in the bone. How is this treated? Treatment for this condition may depend on what  caused the condition. Treatment options include: Taking vitamin D supplements. Your health care provider will suggest what dose is best for you. Taking a calcium supplement. Your health care provider will suggest what dose is best for you. Follow these instructions at home: Eating and drinking  Eat foods that contain vitamin D. Choices include: Fortified dairy products, cereals, or juices. Fortified means that vitamin D has been added to the food. Check the label on the package to see if the food is fortified. Fatty fish, such as salmon or trout. Eggs. Oysters. Mushrooms. The items listed above may not be a complete list of recommended foods and beverages. Contact a dietitian for more information. General instructions Take medicines and supplements only as told by your health care provider. Get regular, safe exposure to natural sunlight. Do not use a tanning bed. Maintain a healthy weight. Lose weight if needed. Keep all follow-up visits as told by your health care provider. This is important. How is this prevented? You can get vitamin D by: Eating foods that naturally contain vitamin D. Eating or drinking products that have been fortified with vitamin D, such as cereals, juices, and dairy products (including milk). Taking a vitamin D supplement or a multivitamin supplement that contains vitamin D. Being in the sun. Your body naturally makes vitamin D when your skin is exposed to sunlight. Your body changes the sunlight into a form of the vitamin that it can use. Contact a health care provider if: Your symptoms do not go away. You feel nauseous or you vomit. You have fewer bowel movements than usual or are constipated. Summary Vitamin  D deficiency is when your body does not have enough vitamin D. Vitamin D is important to your body for good bone health and muscle function, and it may help prevent some diseases. Vitamin D deficiency is primarily treated through supplementation. Your  health care provider will suggest what dose is best for you. You can get vitamin D by eating foods that contain vitamin D, by being in the sun, and by taking a vitamin D supplement or a multivitamin supplement that contains vitamin D. This information is not intended to replace advice given to you by your health care provider. Make sure you discuss any questions you have with your health care provider. Document Revised: 05/27/2018 Document Reviewed: 05/27/2018 Elsevier Patient Education  [place].

## 2021-09-07 ENCOUNTER — Telehealth: Payer: Self-pay

## 2021-09-07 NOTE — Telephone Encounter (Signed)
-----   Message from Doreen Beam, [place] sent at 09/06/2021  9:06 PM EST ----- CMP within normal.CBC ok.  Vitamin D is within normal limits. Can continue script vitamin d and recheck in 3 months.  TSH within normal limits. Triglycerides.  Discuss lifestyle modification with patient e.g. increase exercise, fiber, fruits, vegetables, lean meat, and omega 3/fish intake and decrease saturated fat.  If patient following strict diet and exercise program already please schedule follow up appointment with primary care physician  Correction LDL and Total cholesterol within normal limits.

## 2021-09-07 NOTE — Progress Notes (Signed)
Valproic acid is within normal limits.

## 2021-09-08 NOTE — Progress Notes (Signed)
Phenobarbital level low, managed by neurlogy,  take copy with her  to neurology.

## 2021-09-09 LAB — LAMOTRIGINE LEVEL: Lamotrigine Lvl: 16.4 ug/mL (ref 4.0–18.0)

## 2021-09-09 LAB — PHENOBARBITAL LEVEL: Phenobarbital, Serum: 11.6 mg/L — ABNORMAL LOW (ref 15.0–40.0)

## 2021-09-09 LAB — VALPROIC ACID LEVEL: Valproic Acid Lvl: 59.9 mg/L (ref 50.0–100.0)

## 2021-09-11 NOTE — Progress Notes (Signed)
noted 

## 2021-09-14 ENCOUNTER — Ambulatory Visit: Payer: Medicare Other | Admitting: Nurse Practitioner

## 2021-10-17 ENCOUNTER — Other Ambulatory Visit: Payer: Self-pay | Admitting: Adult Health

## 2021-12-07 ENCOUNTER — Other Ambulatory Visit: Payer: Self-pay

## 2021-12-07 ENCOUNTER — Ambulatory Visit (INDEPENDENT_AMBULATORY_CARE_PROVIDER_SITE_OTHER): Payer: Medicare Other | Admitting: Adult Health

## 2021-12-07 ENCOUNTER — Encounter: Payer: Self-pay | Admitting: Adult Health

## 2021-12-07 VITALS — BP 132/82 | HR 77 | Temp 96.3°F | Ht <= 58 in | Wt 161.4 lb

## 2021-12-07 DIAGNOSIS — Z1389 Encounter for screening for other disorder: Secondary | ICD-10-CM | POA: Diagnosis not present

## 2021-12-07 DIAGNOSIS — J302 Other seasonal allergic rhinitis: Secondary | ICD-10-CM | POA: Insufficient documentation

## 2021-12-07 LAB — URINALYSIS, MICROSCOPIC ONLY

## 2021-12-07 MED ORDER — CETIRIZINE HCL 10 MG PO TABS: 10.0000 mg | ORAL_TABLET | Freq: Every day | ORAL | 2 refills | Status: DC

## 2021-12-07 MED ORDER — FLUTICASONE PROPIONATE 50 MCG/ACT NA SUSP: 2.0000 | Freq: Every day | NASAL | 2 refills | Status: DC

## 2021-12-07 NOTE — Progress Notes (Signed)
Established Patient Office Visit  Subjective:  Patient ID: Dana Carroll, female    DOB: [birth year]  Age: 46 y.o. MRN: [id number]  CC:  Chief Complaint  Patient presents with   Follow-up    3 month follow up     HPI Dana Carroll presents for allergies nasal , taking Claritin plain 10 mg and has been for years. Denies any history of glaucoma.  She is not using Flonase.  She is having increased sneezing. Denies any cough or chest congestion. Denies any head injury/  Has cats in the house and in her bed per mother and mom feels this is contributing. Denies any problems swallowing, throat swelling, eye itching.  Denies any anaphylaxis symptoms.   Accompanied by her mother who denies any other concerns at this time.   Neurology follows her as well for seizures.   Patient  denies any fever, body aches,chills, rash, chest pain, shortness of breath, nausea, vomiting, or diarrhea.  Denies dizziness, lightheadedness, pre syncopal or syncopal episodes.    Past Medical History:  Diagnosis Date   Cancer ([place])    Depression    Seizures ([place])     Past Surgical History:  Procedure Laterality Date   BREAST SURGERY     GALLBLADDER SURGERY      Family History  Problem Relation Age of Onset   High blood pressure Mother    Heart failure Mother        09/2016   Cancer Father    Diabetes Other    High blood pressure Other     Social History   Socioeconomic History   Marital status: Single    Spouse name: Not on file   Number of children: Not on file   Years of education: HS Grad   Highest education level: Not on file  Occupational History   Occupation: Unemployed   Tobacco Use   Smoking status: Never   Smokeless tobacco: Never  Vaping Use   Vaping Use: Never used  Substance and Sexual Activity   Alcohol use: No   Drug use: No   Sexual activity: Not on file  Other Topics Concern   Not on file  Social History Narrative   Patient lives at home with boyfriend,  Dana Carroll   Patient does not work.    Patient is single.    Patient drinks caffeine rare.    Social Determinants of Health   Financial Resource Strain: Not on file  Food Insecurity: Not on file  Transportation Needs: Not on file  Physical Activity: Not on file  Stress: Not on file  Social Connections: Not on file  Intimate Partner Violence: Not on file    Outpatient Medications Prior to Visit  Medication Sig Dispense Refill   clonazePAM (KLONOPIN) 0.5 MG tablet Take 0.5 mg by mouth 2 (two) times daily.     divalproex (DEPAKOTE) 125 MG DR tablet TAKE 1 TABLET BY MOUTH DAILY (Patient taking differently: TAKE 1 TABLET BY MOUTH IN THE MORNING AND 2 TABLETS AT NIGHT) 90 tablet 0   ketoconazole (NIZORAL) 2 % shampoo Apply 1 application topically 2 (two) times a week. 120 mL 1   lamoTRIgine (LAMICTAL) 25 MG tablet Take 3 tablets by mouth 2 (two) times daily.     MAGNESIUM PO Take 400 mg by mouth daily.     vitamin B-12 (CYANOCOBALAMIN) 100 MCG tablet Take 100 mcg by mouth daily.     vitamin C (ASCORBIC ACID) 250 MG tablet Take 250 mg  by mouth daily.     lamoTRIgine (LAMICTAL) 100 MG tablet TAKE 2 TABLETS BY MOUTH IN THE MORNING AND 1 TABLET AT NIGHT (Patient not taking: Reported on 12/07/2021) 270 tablet 0   nystatin ointment (MYCOSTATIN) Apply 1 application topically 2 (two) times daily. (Patient not taking: Reported on 12/07/2021) 30 g 1   PHENobarbital (LUMINAL) 32.4 MG tablet TAKE 2 TABLETS(64.8 MG) BY MOUTH AT BEDTIME (Patient not taking: Reported on 12/07/2021) 180 tablet 0   Vitamin D, Ergocalciferol, (DRISDOL) 1.25 MG (50000 UNIT) CAPS capsule Take 1 capsule (50,000 Units total) by mouth every 7 (seven) days. (taking one tablet per week) (Patient not taking: Reported on 12/07/2021) 12 capsule 0   No facility-administered medications prior to visit.    No Known Allergies  ROS Review of Systems  Constitutional: Negative.   HENT:  Positive for postnasal drip and sneezing. Negative for  congestion, dental problem, drooling, ear discharge, ear pain, facial swelling, hearing loss, mouth sores, nosebleeds, rhinorrhea, sinus pressure, sinus pain, sore throat, tinnitus, trouble swallowing and voice change.   Respiratory: Negative.    Cardiovascular: Negative.   Gastrointestinal: Negative.   Genitourinary: Negative.   Musculoskeletal: Negative.   Neurological: Negative.   Hematological: Negative.   Psychiatric/Behavioral: Negative.       Objective:    Physical Exam Vitals reviewed.  Constitutional:      General: She is not in acute distress.    Appearance: Normal appearance. She is not ill-appearing, toxic-appearing or diaphoretic.  HENT:     Head: Normocephalic and atraumatic.     Right Ear: External ear normal. There is no impacted cerumen.     Left Ear: External ear normal. There is no impacted cerumen.     Ears:     Comments:  Cobblestoning posterior pharynx; bilateral allergic shiners; bilateral TMs air fluid level clear; bilateral nasal turbinates mild edema erythema clear discharge;       Nose: No rhinorrhea.     Comments: Post nasal drip noted. Scant.     Mouth/Throat:     Mouth: Mucous membranes are moist.     Pharynx: Oropharynx is clear.  Eyes:     General: No scleral icterus.    Pupils: Pupils are equal, round, and reactive to light.  Neck:     Vascular: No carotid bruit.  Cardiovascular:     Rate and Rhythm: Normal rate and regular rhythm.     Pulses: Normal pulses.     Heart sounds: Normal heart sounds. No murmur heard.   No friction rub. No gallop.  Pulmonary:     Effort: Pulmonary effort is normal. No respiratory distress.     Breath sounds: Normal breath sounds. No wheezing.  Abdominal:     General: There is no distension.     Palpations: Abdomen is soft.     Tenderness: There is no abdominal tenderness.  Musculoskeletal:        General: Normal range of motion.     Cervical back: Normal range of motion and neck supple. No rigidity or  tenderness.     Right lower leg: No edema.     Left lower leg: No edema.  Lymphadenopathy:     Cervical: No cervical adenopathy.  Skin:    General: Skin is warm.     Findings: No erythema or rash.  Neurological:     Mental Status: She is alert and oriented to person, place, and time. Mental status is at baseline.     Gait: Gait normal.  Psychiatric:        Mood and Affect: Mood normal.        Behavior: Behavior normal.        Judgment: Judgment normal.     Comments: Slowed as baseline.     BP 132/82 (BP Location: Right Arm, Patient Position: Sitting, Cuff Size: Normal) Comment: recheck   Pulse 77    Temp (!) 96.3 F (35.7 C) (Oral) Comment: temporal.   Ht 4' 8.25" (1.429 m)    Wt 161 lb 6.4 oz (73.2 kg)    SpO2 96%    BMI 35.86 kg/m  Wt Readings from Last 3 Encounters:  12/07/21 161 lb 6.4 oz (73.2 kg)  09/06/21 162 lb 3.2 oz (73.6 kg)  02/08/21 157 lb 9.6 oz (71.5 kg)   Vitals with BMI 12/07/2021 09/06/2021 02/08/2021  Height 4' 8.25" 4' 8.25" 4' 7.906"  Weight 161 lbs 6 oz 162 lbs 3 oz 157 lbs 10 oz  BMI 35.85 41.28 78.67  Systolic 672 094 709  Diastolic 82 82 90  Pulse 77 72 89      Health Maintenance Due  Topic Date Due   HIV Screening  Never done   Hepatitis C Screening  Never done   PAP SMEAR-Modifier  Never done    There are no preventive care reminders to display for this patient.  Lab Results  Component Value Date   TSH 3.09 09/06/2021   Lab Results  Component Value Date   WBC 8.9 09/06/2021   HGB 12.5 09/06/2021   HCT 38.2 09/06/2021   MCV 85.5 09/06/2021   PLT 287.0 09/06/2021   Lab Results  Component Value Date   NA 139 09/06/2021   K 3.8 09/06/2021   CO2 27 09/06/2021   GLUCOSE 92 09/06/2021   BUN 10 09/06/2021   CREATININE 0.92 09/06/2021   BILITOT 0.3 09/06/2021   ALKPHOS 61 09/06/2021   AST 16 09/06/2021   ALT 16 09/06/2021   PROT 6.8 09/06/2021   ALBUMIN 4.0 09/06/2021   CALCIUM 9.0 09/06/2021   ANIONGAP 5 (L) 08/17/2012   GFR  76.14 09/06/2021   Lab Results  Component Value Date   CHOL 164 09/06/2021   Lab Results  Component Value Date   HDL 41.90 09/06/2021   Lab Results  Component Value Date   LDLCALC 90 09/06/2021   Lab Results  Component Value Date   TRIG 160.0 (H) 09/06/2021   Lab Results  Component Value Date   CHOLHDL 4 09/06/2021   No results found for: HGBA1C    Assessment & Plan:   Problem List Items Addressed This Visit       Other   Screening for blood or protein in urine   Relevant Orders   Urine Microscopic   Urine Culture   Seasonal allergies - Primary   Relevant Medications   fluticasone (FLONASE) 50 MCG/ACT nasal spray   cetirizine (ZYRTEC) 10 MG tablet   Stop Claritin, and try below. Meds ordered this encounter  Medications   fluticasone (FLONASE) 50 MCG/ACT nasal spray    Sig: Place 2 sprays into both nostrils daily.    Dispense:  16 g    Refill:  2   cetirizine (ZYRTEC) 10 MG tablet    Sig: Take 1 tablet (10 mg total) by mouth daily.    Dispense:  30 tablet    Refill:  2   Return in about 3 months (around 03/09/2022), or if symptoms worsen or fail to improve, for at any  time for any worsening symptoms, Go to Emergency room/ urgent care if worse.  Red Flags discussed. The patient was given clear instructions to go to ER or return to medical center if any red flags develop, symptoms do not improve, worsen or new problems develop. They verbalized understanding.   Follow-up: Return in about 3 months (around 03/09/2022), or if symptoms worsen or fail to improve, for at any time for any worsening symptoms, Go to Emergency room/ urgent care if worse.  Patient is aware that his primary care provider is leaving the office and last day will be December 22, 2021 and he will need to establish care with a new primary care provider, list is available upfront for patient to help him with establishing care or they can choose a provider of their choice.Patient verbalized understanding  of all instructions given and denies any further questions at this time.    Marcille Buffy, FNP

## 2021-12-07 NOTE — Patient Instructions (Signed)
Allergies, Adult ?An allergy means that your body reacts to something that bothers it (allergen). This can happen from something that you eat, breathe in, or touch. ?Allergies often affect the nose, eyes, skin, and stomach. They can be mild, moderate, or very bad (severe). An allergy cannot spread from person to person. They can happen at any age. Sometimes, people outgrow them. ?What are the causes? ?Outdoor things, such as pollen, car fumes, and mold. ?Indoor things, such as dust, smoke, mold, and pets. ?Foods. ?Medicines. ?Things that bother your skin, such as perfume and bug bites. ?What increases the risk? ?Having family members with allergies or asthma. ?What are the signs or symptoms? ?Symptoms depend on how bad your allergy is. ?Mild to moderate symptoms ?Runny nose, stuffy nose, or sneezing. ?Itchy mouth, ears, or throat. ?A feeling of mucus dripping down the back of your throat. ?Sore throat. ?Eyes that are itchy, red, watery, or puffy. ?A skin rash, or red, swollen areas of skin (hives). ?Stomach cramps or bloating. ?Severe symptoms ?Very bad allergies to food, medicine, or bug bites may cause a very bad allergy reaction (anaphylaxis). This can be life-threatening. Symptoms include: ?A red face. ?Wheezing or coughing. ?Swollen lips, tongue, or mouth. ?Tight or swollen throat. ?Chest pain or tightness, or a fast heartbeat. ?Trouble breathing or shortness of breath. ?Pain in your belly (abdomen), vomiting, or watery poop (diarrhea). ?Feeling dizzy or fainting. ?How is this treated? ?  ?Treatment for this condition depends on your symptoms. Treatment may include: ?Cold, wet cloths for itching and swelling. ?Eye drops, nose sprays, or skin creams. ?Washing out your nose each day. ?A humidifier. ?Medicines. ?A change to the foods you eat. ?Being exposed again and again to tiny amounts of allergens. This helps your body get used to them. You might have: ?Allergy shots. ?Very small amounts of allergen put under  your tongue. ?An emergency shot (auto-injector pen) if you have a very bad allergy reaction. ?This is a medicine with a needle. You can put it into your skin by yourself. ?Your doctor will teach you how to use it. ?Follow these instructions at home: ?Medicines ? ?Take or apply over-the-counter and prescription medicines only as told by your doctor. ?If you are at risk for a very bad allergy reaction, keep an auto-injector pen with you all the time. ?Eating and drinking ?Follow instructions from your doctor about what to eat and drink. ?Drink enough fluid to keep your pee (urine) pale yellow. ?General instructions ?If you have ever had a very bad allergy reaction, wear a medical alert bracelet or necklace. ?Stay away from things that you are allergic to. ?Keep all follow-up visits as told by your doctor. This is important. ?Contact a doctor if: ?Your symptoms do not get better with treatment. ?Get help right away if: ?You have symptoms of a very bad allergy reaction. These include: ?A swollen mouth, tongue, or throat. ?Pain or tightness in your chest. ?Trouble breathing. ?Being short of breath. ?Dizziness. ?Fainting. ?Very bad pain in your belly. ?Vomiting. ?Watery poop. ?These symptoms may be an emergency. Do not wait to see if the symptoms will go away. Get medical help right away. Call your local emergency services (911 in the [place] Do not drive yourself to the hospital. ?Summary ?Take or apply over-the-counter and prescription medicines only as told by your doctor. ?Stay away from things you are allergic to. ?If you are at risk for a very bad allergy reaction, carry an auto-injector pen all the time. ?Wear a  medical alert bracelet or necklace. ?Very bad allergy reactions can be life-threatening. Get help right away. ?This information is not intended to replace advice given to you by your health care provider. Make sure you discuss any questions you have with your health care provider. ?Document Revised:  07/30/2019 Document Reviewed: 07/30/2019 ?Elsevier Patient Education ? [place]. ? ?

## 2021-12-09 ENCOUNTER — Other Ambulatory Visit: Payer: Self-pay

## 2021-12-09 ENCOUNTER — Emergency Department
Admission: EM | Admit: 2021-12-09 | Discharge: 2021-12-09 | Disposition: A | Discharge status: Home / Self Care | Payer: Medicare Other | Attending: Emergency Medicine | Admitting: Emergency Medicine

## 2021-12-09 ENCOUNTER — Encounter: Payer: Self-pay | Admitting: Emergency Medicine

## 2021-12-09 DIAGNOSIS — T50901A Poisoning by unspecified drugs, medicaments and biological substances, accidental (unintentional), initial encounter: Secondary | ICD-10-CM | POA: Insufficient documentation

## 2021-12-09 LAB — CBC WITH DIFFERENTIAL/PLATELET
Abs Immature Granulocytes: 0.03 K/uL (ref 0.00–0.07)
Basophils Absolute: 0 K/uL (ref 0.0–0.1)
Basophils Relative: 0 %
Eosinophils Absolute: 0.1 K/uL (ref 0.0–0.5)
Eosinophils Relative: 2 %
HCT: 36.4 % (ref 36.0–46.0)
Hemoglobin: 12.3 g/dL (ref 12.0–15.0)
Immature Granulocytes: 0 %
Lymphocytes Relative: 34 %
Lymphs Abs: 2.6 K/uL (ref 0.7–4.0)
MCH: 27.5 pg (ref 26.0–34.0)
MCHC: 33.8 g/dL (ref 30.0–36.0)
MCV: 81.3 fL (ref 80.0–100.0)
Monocytes Absolute: 0.6 K/uL (ref 0.1–1.0)
Monocytes Relative: 8 %
Neutro Abs: 4.2 K/uL (ref 1.7–7.7)
Neutrophils Relative %: 56 %
Platelets: 321 K/uL (ref 150–400)
RBC: 4.48 MIL/uL (ref 3.87–5.11)
RDW: 15.2 % (ref 11.5–15.5)
WBC: 7.5 K/uL (ref 4.0–10.5)
nRBC: 0.3 % — ABNORMAL HIGH (ref 0.0–0.2)

## 2021-12-09 LAB — COMPREHENSIVE METABOLIC PANEL
ALT: 24 U/L (ref 0–44)
AST: 24 U/L (ref 15–41)
Albumin: 3.8 g/dL (ref 3.5–5.0)
Alkaline Phosphatase: 61 U/L (ref 38–126)
Anion gap: 5 (ref 5–15)
BUN: 11 mg/dL (ref 6–20)
CO2: 32 mmol/L (ref 22–32)
Calcium: 9 mg/dL (ref 8.9–10.3)
Chloride: 105 mmol/L (ref 98–111)
Creatinine, Ser: 1.17 mg/dL — ABNORMAL HIGH (ref 0.44–1.00)
GFR, Estimated: 59 mL/min — ABNORMAL LOW (ref >60)
Glucose, Bld: 139 mg/dL — ABNORMAL HIGH (ref 70–99)
Potassium: 3.3 mmol/L — ABNORMAL LOW (ref 3.5–5.1)
Sodium: 142 mmol/L (ref 135–145)
Total Bilirubin: 0.4 mg/dL (ref 0.3–1.2)
Total Protein: 7.4 g/dL (ref 6.5–8.1)

## 2021-12-09 LAB — URINE CULTURE
MICRO NUMBER:: 13104262
Result:: NO GROWTH
SPECIMEN QUALITY:: ADEQUATE

## 2021-12-09 LAB — POC URINE PREG, ED: Preg Test, Ur: NEGATIVE

## 2021-12-09 MED ORDER — LACTATED RINGERS IV BOLUS
1000.0000 mL | Freq: Once | INTRAVENOUS | Status: AC
  Administered 2021-12-09: 1000 mL via INTRAVENOUS

## 2021-12-09 NOTE — ED Triage Notes (Signed)
Pt comes into the ED via POV c/o ingestion of the wrong medicine.  Pt was supposed to take her medication in her medicine box, but by accident took her mothers medications in her medicine box:  carvedilol 12.'5mg'$ , furosemide '40mg'$ , amiodarone '200mg'$ , potassium 37mq, losartan '50mg'$ , and eliquis '5mg'$ .  Pt states she is asymptomatic.   ?

## 2021-12-09 NOTE — ED Notes (Signed)
RN to bedside to introduce self to pt And initiate care. MD at bedside as well to discuss plan with family.  ?

## 2021-12-09 NOTE — ED Provider Notes (Addendum)
? ?[facility] ?Provider Note ? ? ? Event Date/Time  ? First MD Initiated Contact with Patient 12/09/21 1120   ?  (approximate) ? ? ?History  ? ?Ingestion ? ? ?HPI ? ?Dana Carroll is a 46 y.o. female with a history of seizure disorder who comes to the ED after accidentally taking her mother's medication today.  The patient had intended to take herbal medicine from her pillbox but instead accidentally grabbed her mother's pillbox and took mom's medicine which includes carvedilol, furosemide, amiodarone, potassium, losartan, Eliquis.  Patient states that she feels fine, no acute symptoms.  No chest pain or shortness of breath or dizziness. ?  ? ? ?Physical Exam  ? ?Triage Vital Signs: ?ED Triage Vitals  ?Enc Vitals Group  ?   BP 12/09/21 1123 140/79  ?   Pulse Rate 12/09/21 1123 79  ?   Resp 12/09/21 1123 16  ?   Temp 12/09/21 1123 98.3 ?F (36.8 ?C)  ?   Temp Source 12/09/21 1123 Oral  ?   SpO2 12/09/21 1123 96 %  ?   Weight 12/09/21 1119 161 lb 6 oz (73.2 kg)  ?   Height 12/09/21 1119 '4\' 9"'$  (1.448 m)  ?   Head Circumference --   ?   Peak Flow --   ?   Pain Score 12/09/21 1119 0  ?   Pain Loc --   ?   Pain Edu? --   ?   Excl. in [place]? --   ? ? ?Most recent vital signs: ?Vitals:  ? 12/09/21 1308 12/09/21 1315  ?BP:  138/89  ?Pulse: (!) 55 (!) 54  ?Resp:  16  ?Temp:    ?SpO2: 97% 96%  ? ? ? ?General: Awake, no distress.  ?CV:  Good peripheral perfusion.  Regular rate rhythm, heart rate 75 ?Resp:  Normal effort.  Clear to auscultation bilaterally ?Abd:  No distention.  Soft and nontender ?Other:  No rash, no edema.  Moist mucosa ? ? ?ED Results / Procedures / Treatments  ? ?Labs ?(all labs ordered are listed, but only abnormal results are displayed) ?Labs Reviewed  ?COMPREHENSIVE METABOLIC PANEL - Abnormal; Notable for the following components:  ?    Result Value  ? Potassium 3.3 (*)   ? Glucose, Bld 139 (*)   ? Creatinine, Ser 1.17 (*)   ? GFR, Estimated 59 (*)   ? All other components within  normal limits  ?CBC WITH DIFFERENTIAL/PLATELET - Abnormal; Notable for the following components:  ? nRBC 0.3 (*)   ? All other components within normal limits  ?POC URINE PREG, ED  ? ? ? ?EKG ? ?Interpreted by me ?Normal sinus rhythm rate of 81.  Normal axis and intervals.  Normal QRS ST segments and T waves.  No evidence of cardiac toxicity. ? ? ?RADIOLOGY ? ? ? ? ?PROCEDURES: ? ?Critical Care performed: No ? ?.1-3 Lead EKG Interpretation ?Performed by: Carrie Mew, MD ?Authorized by: Carrie Mew, MD  ? ?  Interpretation: normal   ?  ECG rate:  75 ?  ECG rate assessment: normal   ?  Rhythm: sinus rhythm   ?  Ectopy: none   ?  Conduction: normal   ? ? ?MEDICATIONS ORDERED IN ED: ?Medications  ?lactated ringers bolus 1,000 mL (1,000 mLs Intravenous New Bag/Given 12/09/21 1149)  ? ? ? ?IMPRESSION / MDM / ASSESSMENT AND PLAN / ED COURSE  ?I reviewed the triage vital signs and the nursing notes. ?             ?               ? ? ? ?**  The patient is on the cardiac monitor to evaluate for evidence of arrhythmia and/or significant heart rate changes.**} ? ?Clinical Course as of 12/09/21 1353  ?Fri Dec 09, 2021  ?1138 Patient presents with accidental ingestion of a dose of carvedilol, amiodarone, furosemide, potassium, Eliquis.  We will need to observe on telemetry monitoring the ED for a few hours to ensure no bradycardia or hypotension.  Patient has no acute complaints currently. [PS]  ?Emmet are all unremarkable.  Vitals remained stable, although heart rate has decreased to about 60.  We will continue monitoring blood pressure. [PS]  ?  ?Clinical Course User Index ?[PS] Carrie Mew, MD  ? ? ? ?----------------------------------------- ?2:54 PM on 12/09/2021 ?----------------------------------------- ?Patient sleeping comfortably, no symptoms.  Heart rate ranging 60-70 at rest.  Ingestion was at 7 AM, so now 8 hours later where well past peak effect of the carvedilol.  Does not require hospitalization  due to hemodynamic stability and absence of symptoms.  Will discharge with mother ? ?FINAL CLINICAL IMPRESSION(S) / ED DIAGNOSES  ? ?Final diagnoses:  ?Accidental drug ingestion, initial encounter  ? ? ? ?Rx / DC Orders  ? ?ED Discharge Orders   ? ? None  ? ?  ? ? ? ?Note:  This document was prepared using Dragon voice recognition software and may include unintentional dictation errors. ?  ?Carrie Mew, MD ?12/09/21 1353 ? ?  ?Carrie Mew, MD ?12/09/21 1455 ? ?

## 2021-12-09 NOTE — ED Notes (Signed)
Pt ambulated to toilet then back to b ed.  ?

## 2021-12-12 ENCOUNTER — Telehealth: Payer: Self-pay

## 2021-12-12 NOTE — Progress Notes (Signed)
No growth on urine culture. No treatment needed.  ?Follow up as needed.  ? ? ?

## 2021-12-12 NOTE — Telephone Encounter (Signed)
-----   Message from Doreen Beam, [place] sent at 12/12/2021 10:33 AM EDT ----- ?No growth on urine culture. No treatment needed.  ?Follow up as needed.  ? ? ? ?

## 2022-04-14 ENCOUNTER — Encounter: Payer: Medicare Other | Admitting: Internal Medicine

## 2022-05-10 ENCOUNTER — Encounter: Payer: Medicare Other | Admitting: Internal Medicine

## 2022-06-16 ENCOUNTER — Encounter: Payer: Self-pay | Admitting: Internal Medicine

## 2022-06-16 ENCOUNTER — Ambulatory Visit (INDEPENDENT_AMBULATORY_CARE_PROVIDER_SITE_OTHER): Payer: Medicare Other | Admitting: Internal Medicine

## 2022-06-16 DIAGNOSIS — J302 Other seasonal allergic rhinitis: Secondary | ICD-10-CM

## 2022-06-16 DIAGNOSIS — G40209 Localization-related (focal) (partial) symptomatic epilepsy and epileptic syndromes with complex partial seizures, not intractable, without status epilepticus: Secondary | ICD-10-CM

## 2022-06-16 DIAGNOSIS — Z124 Encounter for screening for malignant neoplasm of cervix: Secondary | ICD-10-CM | POA: Diagnosis not present

## 2022-06-16 DIAGNOSIS — M25561 Pain in right knee: Secondary | ICD-10-CM | POA: Diagnosis not present

## 2022-06-16 MED ORDER — FEXOFENADINE HCL 60 MG PO TABS: 60.0000 mg | ORAL_TABLET | Freq: Two times a day (BID) | ORAL | 1 refills | Status: DC | PRN

## 2022-06-16 NOTE — Progress Notes (Unsigned)
Patient ID: Dana Carroll, female   DOB: 12-29-1975, 46 y.o.   MRN: [id number]   Subjective:    Patient ID: Dana Carroll, female    DOB: 1976/03/20, 46 y.o.   MRN: [id number]   Patient here for  Chief Complaint  Patient presents with   Establish Care    Transfer of Jennette   .   HPI Sees neurology for f/u seizures.  On lamictal - recent increase in dose.  Continues on depakote and clonazepam. Has a history of allergies.  Last week - increased congestion and cough.  Increased mucus production.  Is better this week.  Pearletha Furl some congestion, but overall improved.  Had questions about antihistamines.  Needs non sedating.  Took alka seltzer plus and robitussin CF.  No chest pain.  No sob.  Eating.  No nausea or vomiting reported.  No abdominal pain.  Bowels moving.  Some right knee pain. Discussed PT.    Past Medical History:  Diagnosis Date   Cancer ([place])    Depression    Seizures ([place])    Past Surgical History:  Procedure Laterality Date   BREAST SURGERY     GALLBLADDER SURGERY     Family History  Problem Relation Age of Onset   High blood pressure Mother    Heart failure Mother        09/2016   Cancer Father    Diabetes Other    High blood pressure Other    Social History   Socioeconomic History   Marital status: Single    Spouse name: Not on file   Number of children: Not on file   Years of education: HS Grad   Highest education level: Not on file  Occupational History   Occupation: Unemployed   Tobacco Use   Smoking status: Never   Smokeless tobacco: Never  Vaping Use   Vaping Use: Never used  Substance and Sexual Activity   Alcohol use: No   Drug use: No   Sexual activity: Not on file  Other Topics Concern   Not on file  Social History Narrative   Patient lives at home with boyfriend, Hilaria Ota   Patient does not work.    Patient is single.    Patient drinks caffeine rare.    Social Determinants of Health   Financial Resource Strain:  Not on file  Food Insecurity: Not on file  Transportation Needs: Not on file  Physical Activity: Not on file  Stress: Not on file  Social Connections: Not on file     Review of Systems  Constitutional:  Negative for appetite change and unexpected weight change.  HENT:  Positive for congestion and postnasal drip.   Respiratory:  Negative for chest tightness and shortness of breath.        Cough is better.   Cardiovascular:  Negative for chest pain, palpitations and leg swelling.  Gastrointestinal:  Negative for abdominal pain, diarrhea, nausea and vomiting.  Genitourinary:  Negative for difficulty urinating and dysuria.  Musculoskeletal:  Negative for joint swelling and myalgias.  Skin:  Negative for color change and rash.  Neurological:  Negative for dizziness, light-headedness and headaches.  Psychiatric/Behavioral:  Negative for agitation and dysphoric mood.        Objective:     BP 124/80 (BP Location: Left Arm, Patient Position: Sitting, Cuff Size: Normal)   Pulse 63   Temp 97.8 F (36.6 C) (Oral)   Ht '4\' 9"'$  (1.448 m)   Wt 158  lb 12.8 oz (72 kg)   SpO2 98%   BMI 34.36 kg/m  Wt Readings from Last 3 Encounters:  06/16/22 158 lb 12.8 oz (72 kg)  12/09/21 161 lb 6 oz (73.2 kg)  12/07/21 161 lb 6.4 oz (73.2 kg)    Physical Exam Vitals reviewed.  Constitutional:      General: She is not in acute distress.    Appearance: Normal appearance.  HENT:     Head: Normocephalic and atraumatic.     Right Ear: External ear normal.     Left Ear: External ear normal.  Eyes:     General: No scleral icterus.       Right eye: No discharge.        Left eye: No discharge.     Conjunctiva/sclera: Conjunctivae normal.  Neck:     Thyroid: No thyromegaly.  Cardiovascular:     Rate and Rhythm: Normal rate and regular rhythm.  Pulmonary:     Effort: No respiratory distress.     Breath sounds: Normal breath sounds. No wheezing.  Abdominal:     General: Bowel sounds are normal.      Palpations: Abdomen is soft.     Tenderness: There is no abdominal tenderness.  Musculoskeletal:        General: No swelling or tenderness.     Cervical back: Neck supple. No tenderness.  Lymphadenopathy:     Cervical: No cervical adenopathy.  Skin:    Findings: No erythema or rash.  Neurological:     Mental Status: She is alert.  Psychiatric:        Mood and Affect: Mood normal.        Behavior: Behavior normal.      Outpatient Encounter Medications as of 06/16/2022  Medication Sig   clonazePAM (KLONOPIN) 0.5 MG tablet Take 0.5 mg by mouth 2 (two) times daily.   divalproex (DEPAKOTE) 125 MG DR tablet TAKE 1 TABLET BY MOUTH DAILY (Patient taking differently: TAKE 1 TABLET BY MOUTH IN THE MORNING AND 2 TABLETS AT NIGHT)   fexofenadine (ALLEGRA ALLERGY) 60 MG tablet Take 1 tablet (60 mg total) by mouth 2 (two) times daily as needed for allergies or rhinitis.   fluticasone (FLONASE) 50 MCG/ACT nasal spray Place 2 sprays into both nostrils daily.   ketoconazole (NIZORAL) 2 % shampoo Apply 1 application topically 2 (two) times a week.   lamoTRIgine (LAMICTAL) 25 MG tablet Take 3 tablets by mouth 2 (two) times daily.   MAGNESIUM PO Take 400 mg by mouth daily.   vitamin B-12 (CYANOCOBALAMIN) 100 MCG tablet Take 100 mcg by mouth daily.   vitamin C (ASCORBIC ACID) 250 MG tablet Take 250 mg by mouth daily.   [DISCONTINUED] cetirizine (ZYRTEC) 10 MG tablet Take 1 tablet (10 mg total) by mouth daily. (Patient not taking: Reported on 06/16/2022)   No facility-administered encounter medications on file as of 06/16/2022.     Lab Results  Component Value Date   WBC 7.5 12/09/2021   HGB 12.3 12/09/2021   HCT 36.4 12/09/2021   PLT 321 12/09/2021   GLUCOSE 139 (H) 12/09/2021   CHOL 164 09/06/2021   TRIG 160.0 (H) 09/06/2021   HDL 41.90 09/06/2021   LDLCALC 90 09/06/2021   ALT 24 12/09/2021   AST 24 12/09/2021   NA 142 12/09/2021   K 3.3 (L) 12/09/2021   CL 105 12/09/2021   CREATININE  1.17 (H) 12/09/2021   BUN 11 12/09/2021   CO2 32 12/09/2021   TSH  3.09 09/06/2021       Assessment & Plan:   Problem List Items Addressed This Visit     Cervical cancer screening    Needs pelvic/pap/gyn evaluation.  Mother request referral to [facility].        Relevant Orders   Ambulatory referral to Gynecology   Right knee pain    Knee pain as outlined.  PT referral.        Relevant Orders   Ambulatory referral to Physical Therapy   Seasonal allergies    Has a history of allergies.  Increased congestion and cough last week. Improved now.  Treat with allegra as directed.  flonase nasal spray/saline nasal spray.  Follow.        Seizure disorder, complex partial [facility])    Seeing neurology.  On lamictal and clonazepam.          Einar Pheasant, MD

## 2022-06-16 NOTE — Patient Instructions (Signed)
Saline nasal spray - flush nose 1-2x/day  Flonase nasal spray - 2 sprays each nostril one time per day.  Do this in the evening.

## 2022-06-17 ENCOUNTER — Encounter: Payer: Self-pay | Admitting: Internal Medicine

## 2022-06-17 DIAGNOSIS — Z124 Encounter for screening for malignant neoplasm of cervix: Secondary | ICD-10-CM | POA: Insufficient documentation

## 2022-06-17 DIAGNOSIS — M25561 Pain in right knee: Secondary | ICD-10-CM | POA: Insufficient documentation

## 2022-06-17 NOTE — Assessment & Plan Note (Signed)
Has a history of allergies.  Increased congestion and cough last week. Improved now.  Treat with allegra as directed.  flonase nasal spray/saline nasal spray.  Follow.

## 2022-06-17 NOTE — Assessment & Plan Note (Signed)
Knee pain as outlined.  PT referral.

## 2022-06-17 NOTE — Assessment & Plan Note (Signed)
Needs pelvic/pap/gyn evaluation.  Mother request referral to [facility].

## 2022-06-17 NOTE — Assessment & Plan Note (Signed)
Seeing neurology.  On lamictal and clonazepam.

## 2022-07-11 ENCOUNTER — Encounter: Payer: Self-pay | Admitting: Obstetrics and Gynecology

## 2022-07-18 ENCOUNTER — Ambulatory Visit: Payer: Medicare Other | Attending: Internal Medicine

## 2022-08-01 ENCOUNTER — Other Ambulatory Visit (HOSPITAL_COMMUNITY)
Admission: RE | Admit: 2022-08-01 | Discharge: 2022-08-01 | Disposition: A | Discharge status: Home / Self Care | Payer: Medicare Other | Source: Ambulatory Visit | Attending: Obstetrics | Admitting: Obstetrics

## 2022-08-01 ENCOUNTER — Ambulatory Visit (INDEPENDENT_AMBULATORY_CARE_PROVIDER_SITE_OTHER): Payer: Medicare Other | Admitting: Obstetrics

## 2022-08-01 ENCOUNTER — Telehealth: Payer: Self-pay | Admitting: Obstetrics

## 2022-08-01 ENCOUNTER — Encounter: Payer: Self-pay | Admitting: Obstetrics

## 2022-08-01 VITALS — BP 128/105 | HR 81 | Ht <= 58 in | Wt 156.0 lb

## 2022-08-01 DIAGNOSIS — Z1231 Encounter for screening mammogram for malignant neoplasm of breast: Secondary | ICD-10-CM

## 2022-08-01 DIAGNOSIS — Z01419 Encounter for gynecological examination (general) (routine) without abnormal findings: Secondary | ICD-10-CM | POA: Diagnosis not present

## 2022-08-01 DIAGNOSIS — Z1151 Encounter for screening for human papillomavirus (HPV): Secondary | ICD-10-CM | POA: Diagnosis not present

## 2022-08-01 DIAGNOSIS — N852 Hypertrophy of uterus: Secondary | ICD-10-CM | POA: Diagnosis not present

## 2022-08-01 DIAGNOSIS — Z124 Encounter for screening for malignant neoplasm of cervix: Secondary | ICD-10-CM

## 2022-08-01 NOTE — Telephone Encounter (Signed)
Reached out to pt to schedule [place] per Missy.  Pt could schedule at her convenience.  Left message for pt to call back to schedule.

## 2022-08-01 NOTE — Progress Notes (Signed)
SUBJECTIVE  HPI  Dana Carroll is a 46 y.o.-year-old G0 who presents for an annual gynecological exam and Pap smear today. She has no health concerns today. She had heavy menstrual bleeding for years but has had no period for the past three months. She has an IUD in place but is unsure when it was placed. She denies pelvic pain, abnormal discharge, and UTI symptoms. She has never been sexually active. 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 Her mother is accompanying her today.  Medical/Surgical History Past Medical History:  Diagnosis Date   Cancer ([place])    Depression    Seizures ([place])    Past Surgical History:  Procedure Laterality Date   BREAST SURGERY     GALLBLADDER SURGERY      Social History Lives with mother Does not work Exercise: treadmill, dance, exercise bike Substances: denies EtOH, tobacco, vape, and recreational drugs  Obstetric History OB History   No obstetric history on file.      GYN/Menstrual History Patient's last menstrual period was 04/10/2022. Irregular periods Last Pap: unsure Contraception: abstinence, IUD  Prevention Mammogram: a few years ago Colonoscopy: at 60 Flu shot/vaccines: desires flu shot today  Current Medications Outpatient Medications Prior to Visit  Medication Sig   clonazePAM (KLONOPIN) 0.5 MG tablet Take 0.5 mg by mouth 2 (two) times daily.   divalproex (DEPAKOTE) 125 MG DR tablet TAKE 1 TABLET BY MOUTH DAILY (Patient taking differently: TAKE 1 TABLET BY MOUTH IN THE MORNING AND 2 TABLETS AT NIGHT)   fexofenadine (ALLEGRA ALLERGY) 60 MG tablet Take 1 tablet (60 mg total) by mouth 2 (two) times daily as needed for allergies or rhinitis.   fluticasone (FLONASE) 50 MCG/ACT nasal spray Place 2 sprays into both nostrils daily.   lamoTRIgine (LAMICTAL) 25 MG tablet Take 100 mg by mouth 2 (two) times daily.   MAGNESIUM PO Take 400 mg by mouth daily.   vitamin  B-12 (CYANOCOBALAMIN) 100 MCG tablet Take 100 mcg by mouth daily.   vitamin C (ASCORBIC ACID) 250 MG tablet Take 250 mg by mouth daily.   ketoconazole (NIZORAL) 2 % shampoo Apply 1 application topically 2 (two) times a week. (Patient not taking: Reported on 08/01/2022)   No facility-administered medications prior to visit.      ROS History obtained from mother and chart review General ROS: negative for - chills, fatigue, or fever Psychological ROS: positive for - anxiety and depression Ophthalmic ROS: negative for - blurry vision or decreased vision ENT ROS: negative for - headaches or sore throat Hematological and Lymphatic ROS: negative for - bleeding problems, bruising, or swollen lymph nodes Endocrine ROS: positive for - polydipsia/polyuria negative for - breast changes, hot flashes, or palpitations Breast ROS: negative for breast lumps Respiratory ROS: no cough, shortness of breath, or wheezing Cardiovascular ROS: no chest pain or dyspnea on exertion Gastrointestinal ROS: no abdominal pain, change in bowel habits, or black or bloody stools Genito-Urinary ROS: no dysuria, trouble voiding, or hematuria Musculoskeletal ROS: negative Dermatological ROS: negative     06/16/2022   10:41 AM 02/08/2021   10:55 AM  Depression screen PHQ 2/9  Decreased Interest 0 1  Down, Depressed, Hopeless 0 1  PHQ - 2 Score 0 2  Altered sleeping  0  Tired, decreased energy  0  Change in appetite  0  Feeling bad or failure about yourself   1  Trouble concentrating  0  Moving slowly  or fidgety/restless  0  Suicidal thoughts  0  PHQ-9 Score  3  Difficult doing work/chores  Not difficult at all     OBJECTIVE  Last Weight  Most recent update: 08/01/2022 11:07 AM    Weight  70.8 kg (156 lb)             Body mass index is 34.97 kg/m.    BP (!) 128/105   Pulse 81   Ht '4\' 8"'$  (1.422 m)   Wt 156 lb (70.8 kg)   LMP 04/10/2022   BMI 34.97 kg/m  General appearance: alert, cooperative,  and appears stated age Head: Normocephalic, without obvious abnormality, atraumatic Eyes: negative findings: lids and lashes normal and conjunctivae and sclerae normal Neck: no adenopathy, supple, symmetrical, trachea midline, and thyroid not enlarged, symmetric, no tenderness/mass/nodules Back: symmetric, no curvature. ROM normal. No CVA tenderness. Lungs: clear to auscultation bilaterally Breasts: normal appearance, no masses or tenderness, Inspection negative, No nipple retraction or dimpling, No axillary or supraclavicular adenopathy, Normal to palpation without dominant masses Heart: regular rate and rhythm, S1, S2 normal, no murmur, click, rub or gallop Abdomen: soft, non-tender; bowel sounds normal; no masses,  no organomegaly Pelvic: cervix normal in appearance, external genitalia normal, no adnexal masses or tenderness, no cervical motion tenderness, rectovaginal septum normal, vagina normal without discharge, and IUD strings visualized, Pap collected. Uterus feels firm and enlarged to 12 week size. Extremities: extremities normal, atraumatic, no cyanosis or edema Pulses: 2+ and symmetric Skin: Skin color, texture, turgor normal. No rashes or lesions Lymph nodes: Cervical, supraclavicular, and axillary nodes normal.  ASSESSMENT  1) Annual exam 2) Due for Pap 3) Enlarged uterus 4) Desires flu shot  PLAN 1) Physical exam as noted. Etana and her mother had forgotten there was an IUD in place and think it was placed relatively recently but will find out. Discussed healthy lifestyle choices and preventive care. Declines STI testing. Lab: CBC, TSH, lipid panel, A1C. 2) Pap collected. F/u based on results. 3) Pelvic US ordered 4) Flu shot administered  Return to care for annual visit in one year or for IUD removal if indicated.   Lloyd Huger, CNM

## 2022-08-02 LAB — CBC
Hematocrit: 37.4 % (ref 34.0–46.6)
Hemoglobin: 12.6 g/dL (ref 11.1–15.9)
MCH: 26.8 pg (ref 26.6–33.0)
MCHC: 33.7 g/dL (ref 31.5–35.7)
MCV: 79 fL (ref 79–97)
Platelets: 322 10*3/uL (ref 150–450)
RBC: 4.71 x10E6/uL (ref 3.77–5.28)
RDW: 15.8 % — ABNORMAL HIGH (ref 11.7–15.4)
WBC: 7.7 10*3/uL (ref 3.4–10.8)

## 2022-08-02 LAB — LIPID PANEL
Chol/HDL Ratio: 4.4 ratio (ref 0.0–4.4)
Cholesterol, Total: 181 mg/dL (ref 100–199)
HDL: 41 mg/dL (ref >39)
LDL Chol Calc (NIH): 100 mg/dL — ABNORMAL HIGH (ref 0–99)
Triglycerides: 233 mg/dL — ABNORMAL HIGH (ref 0–149)
VLDL Cholesterol Cal: 40 mg/dL (ref 5–40)

## 2022-08-02 LAB — TSH: TSH: 1.2 u[IU]/mL (ref 0.450–4.500)

## 2022-08-02 LAB — HEMOGLOBIN A1C
Est. average glucose Bld gHb Est-mCnc: 105 mg/dL
Hgb A1c MFr Bld: 5.3 % (ref 4.8–5.6)

## 2022-08-07 LAB — CYTOLOGY - PAP
Comment: NEGATIVE
Diagnosis: NEGATIVE
High risk HPV: NEGATIVE

## 2022-08-10 NOTE — Telephone Encounter (Signed)
Pt is scheduled for [place] at [place] on 11/21 at 11:15.

## 2022-08-11 ENCOUNTER — Telehealth: Payer: Self-pay | Admitting: Internal Medicine

## 2022-08-11 ENCOUNTER — Encounter: Payer: Self-pay | Admitting: Obstetrics

## 2022-08-11 NOTE — Telephone Encounter (Signed)
Copied from [place] [id number]. Topic: Medicare AWV >> Aug 11, 2022  1:57 PM Devoria Glassing wrote: Reason for CRM: Left message for patient to schedule Annual Wellness Visit.  Please schedule with Nurse Health Advisor Denisa O'Brien-Blaney, LPN at [facility]. This appt can be telephone or office visit.  Please call [phone] ask for [facility]

## 2022-08-22 ENCOUNTER — Ambulatory Visit (INDEPENDENT_AMBULATORY_CARE_PROVIDER_SITE_OTHER): Payer: Medicare Other

## 2022-08-22 DIAGNOSIS — N852 Hypertrophy of uterus: Secondary | ICD-10-CM

## 2022-08-25 ENCOUNTER — Encounter: Payer: Self-pay | Admitting: Obstetrics

## 2022-09-14 ENCOUNTER — Telehealth: Payer: Self-pay | Admitting: Internal Medicine

## 2022-09-14 NOTE — Telephone Encounter (Signed)
Copied from [place] [phone]. Topic: Medicare AWV >> Sep 14, 2022  9:43 AM Devoria Glassing wrote: Reason for CRM: Attempted to schedule AWV. Unable to LVM.  Will try at later time. Mailbox full

## 2022-10-18 ENCOUNTER — Encounter: Payer: Self-pay | Admitting: Internal Medicine

## 2022-10-18 ENCOUNTER — Ambulatory Visit (INDEPENDENT_AMBULATORY_CARE_PROVIDER_SITE_OTHER): Payer: 59 | Admitting: Internal Medicine

## 2022-10-18 VITALS — BP 128/72 | HR 80 | Temp 97.8°F | Resp 18 | Ht <= 58 in | Wt 157.0 lb

## 2022-10-18 DIAGNOSIS — Z124 Encounter for screening for malignant neoplasm of cervix: Secondary | ICD-10-CM

## 2022-10-18 DIAGNOSIS — R55 Syncope and collapse: Secondary | ICD-10-CM | POA: Diagnosis not present

## 2022-10-18 DIAGNOSIS — G40909 Epilepsy, unspecified, not intractable, without status epilepticus: Secondary | ICD-10-CM | POA: Diagnosis not present

## 2022-10-18 DIAGNOSIS — C9591 Leukemia, unspecified, in remission: Secondary | ICD-10-CM | POA: Diagnosis not present

## 2022-10-18 LAB — CBC WITH DIFFERENTIAL/PLATELET
Basophils Absolute: 0 10*3/uL (ref 0.0–0.1)
Basophils Relative: 0.3 % (ref 0.0–3.0)
Eosinophils Absolute: 0.1 10*3/uL (ref 0.0–0.7)
Eosinophils Relative: 1.4 % (ref 0.0–5.0)
HCT: 37.4 % (ref 36.0–46.0)
Hemoglobin: 12.6 g/dL (ref 12.0–15.0)
Lymphocytes Relative: 34.9 % (ref 12.0–46.0)
Lymphs Abs: 2.8 10*3/uL (ref 0.7–4.0)
MCHC: 33.7 g/dL (ref 30.0–36.0)
MCV: 81.1 fl (ref 78.0–100.0)
Monocytes Absolute: 0.6 10*3/uL (ref 0.1–1.0)
Monocytes Relative: 7.6 % (ref 3.0–12.0)
Neutro Abs: 4.5 10*3/uL (ref 1.4–7.7)
Neutrophils Relative %: 55.8 % (ref 43.0–77.0)
Platelets: 331 10*3/uL (ref 150.0–400.0)
RBC: 4.61 Mil/uL (ref 3.87–5.11)
RDW: 17.7 % — ABNORMAL HIGH (ref 11.5–15.5)
WBC: 8.1 10*3/uL (ref 4.0–10.5)

## 2022-10-18 LAB — BASIC METABOLIC PANEL
BUN: 13 mg/dL (ref 6–23)
CO2: 34 mEq/L — ABNORMAL HIGH (ref 19–32)
Calcium: 10.2 mg/dL (ref 8.4–10.5)
Chloride: 100 mEq/L (ref 96–112)
Creatinine, Ser: 1.02 mg/dL (ref 0.40–1.20)
GFR: 66.75 mL/min (ref >60.00)
Glucose, Bld: 112 mg/dL — ABNORMAL HIGH (ref 70–99)
Potassium: 3.3 mEq/L — ABNORMAL LOW (ref 3.5–5.1)
Sodium: 139 mEq/L (ref 135–145)

## 2022-10-18 NOTE — Progress Notes (Signed)
Subjective:    Patient ID: Dana Carroll, female    DOB: November 10, 1975, 47 y.o.   MRN: [id number]  Patient here for  Chief Complaint  Patient presents with   Medical Management of Chronic Issues    HPI Here to follow up regarding allergies and history of seizures. Accompanied by her mother.  History obtained from both of them.  Sees neurology.  On lamictal and clonazepam.  Mother recently concerned that she was slower to respond and more "disoriented".  Felt related to medication.  Dr Melrose Nakayama decreased the clonazepam to 1/2 tablet.  The "disorientation" has improved.  Has f/u with neurology tomorrow.  She has fallen recently.  On questioning, no head injury.  No residual injury from the fall.  Fell forward.  She does described that when standing - for any period of time - will feel like she needs to sit down - or will "go down" if not.  No headache.  No increased dizziness reported.  No chest pain.  Saw gyn - routine exam - 08/01/22.  Has IUD in place.  PAP - normal with negative HPV.  Recommended repeat pap in 2028. Found to have elevated triglycerides.  Pelvic ultrasound - fibroids  - unchanged.     Past Medical History:  Diagnosis Date   Cancer ([place])    Depression    Seizures ([place])    Past Surgical History:  Procedure Laterality Date   BREAST SURGERY     GALLBLADDER SURGERY     Family History  Problem Relation Age of Onset   High blood pressure Mother    Heart failure Mother        09/2016   Cancer Father    Diabetes Other    High blood pressure Other    Social History   Socioeconomic History   Marital status: Single    Spouse name: Not on file   Number of children: Not on file   Years of education: HS Grad   Highest education level: Not on file  Occupational History   Occupation: Unemployed   Tobacco Use   Smoking status: Never   Smokeless tobacco: Never  Vaping Use   Vaping Use: Never used  Substance and Sexual Activity   Alcohol use: No   Drug use: No    Sexual activity: Not Currently  Other Topics Concern   Not on file  Social History Narrative   Patient lives at home with boyfriend, Hilaria Ota   Patient does not work.    Patient is single.    Patient drinks caffeine rare.    Social Determinants of Health   Financial Resource Strain: Not on file  Food Insecurity: Not on file  Transportation Needs: Not on file  Physical Activity: Not on file  Stress: Not on file  Social Connections: Not on file     Review of Systems  Constitutional:  Negative for appetite change and unexpected weight change.  HENT:  Negative for congestion and sinus pressure.   Respiratory:  Negative for cough, chest tightness and shortness of breath.   Cardiovascular:  Negative for chest pain and palpitations.       Near syncope as outlined.   Gastrointestinal:  Negative for abdominal pain, diarrhea, nausea and vomiting.  Genitourinary:  Negative for difficulty urinating and dysuria.  Musculoskeletal:  Negative for joint swelling and myalgias.  Skin:  Negative for color change and rash.  Neurological:  Negative for dizziness and headaches.  Psychiatric/Behavioral:  Negative for agitation and dysphoric  mood.        Objective:     BP 128/72   Pulse 80   Temp 97.8 F (36.6 C)   Resp 18   Ht '4\' 8"'$  (1.422 m)   Wt 157 lb (71.2 kg)   SpO2 97%   BMI 35.20 kg/m  Wt Readings from Last 3 Encounters:  10/18/22 157 lb (71.2 kg)  08/01/22 156 lb (70.8 kg)  06/16/22 158 lb 12.8 oz (72 kg)    Physical Exam Vitals reviewed.  Constitutional:      General: She is not in acute distress.    Appearance: Normal appearance.  HENT:     Head: Normocephalic and atraumatic.     Right Ear: External ear normal.     Left Ear: External ear normal.  Eyes:     General: No scleral icterus.       Right eye: No discharge.        Left eye: No discharge.     Conjunctiva/sclera: Conjunctivae normal.  Neck:     Thyroid: No thyromegaly.  Cardiovascular:     Rate and Rhythm:  Normal rate and regular rhythm.  Pulmonary:     Effort: No respiratory distress.     Breath sounds: Normal breath sounds. No wheezing.  Abdominal:     General: Bowel sounds are normal.     Palpations: Abdomen is soft.     Tenderness: There is no abdominal tenderness.  Musculoskeletal:        General: No swelling or tenderness.     Cervical back: Neck supple. No tenderness.  Lymphadenopathy:     Cervical: No cervical adenopathy.  Skin:    Findings: No erythema or rash.  Neurological:     Mental Status: She is alert.  Psychiatric:        Mood and Affect: Mood normal.        Behavior: Behavior normal.      Outpatient Encounter Medications as of 10/18/2022  Medication Sig   clonazePAM (KLONOPIN) 0.5 MG tablet Take 0.5 mg by mouth at bedtime.   divalproex (DEPAKOTE) 125 MG DR tablet TAKE 1 TABLET BY MOUTH DAILY (Patient taking differently: TAKE 1 TABLET BY MOUTH IN THE MORNING AND 2 TABLETS AT NIGHT)   fexofenadine (ALLEGRA ALLERGY) 60 MG tablet Take 1 tablet (60 mg total) by mouth 2 (two) times daily as needed for allergies or rhinitis.   fluticasone (FLONASE) 50 MCG/ACT nasal spray Place 2 sprays into both nostrils daily.   ketoconazole (NIZORAL) 2 % shampoo Apply 1 application topically 2 (two) times a week. (Patient not taking: Reported on 08/01/2022)   lamoTRIgine (LAMICTAL) 100 MG tablet Take 100 mg by mouth 2 (two) times daily.   MAGNESIUM PO Take 400 mg by mouth daily.   vitamin B-12 (CYANOCOBALAMIN) 100 MCG tablet Take 100 mcg by mouth daily.   vitamin C (ASCORBIC ACID) 250 MG tablet Take 250 mg by mouth daily.   [DISCONTINUED] clonazePAM (KLONOPIN) 0.5 MG tablet Take 0.5 mg by mouth 2 (two) times daily.   [DISCONTINUED] lamoTRIgine (LAMICTAL) 25 MG tablet Take 100 mg by mouth 2 (two) times daily.   No facility-administered encounter medications on file as of 10/18/2022.     Lab Results  Component Value Date   WBC 8.1 10/18/2022   HGB 12.6 10/18/2022   HCT 37.4  10/18/2022   PLT 331.0 10/18/2022   GLUCOSE 112 (H) 10/18/2022   CHOL 181 08/01/2022   TRIG 233 (H) 08/01/2022   HDL 41 08/01/2022  LDLCALC 100 (H) 08/01/2022   ALT 24 12/09/2021   AST 24 12/09/2021   NA 139 10/18/2022   K 3.3 (L) 10/18/2022   CL 100 10/18/2022   CREATININE 1.02 10/18/2022   BUN 13 10/18/2022   CO2 34 (H) 10/18/2022   TSH 1.200 08/01/2022   HGBA1C 5.3 08/01/2022    No results found.     Assessment & Plan:  Near syncope Assessment & Plan: On lamictal and clonazepam.  Mother recently concerned that she was slower to respond and more "disoriented".  Felt related to medication.  Dr Melrose Nakayama decreased the clonazepam to 1/2 tablet.  The "disorientation" has improved.  Response time has improved. Has f/u with neurology tomorrow.  She has fallen recently.  On questioning, no head injury.  No residual injury from the fall.  Fell forward.  She does described that when standing - for any period of time - will feel like she needs to sit down - or will "go down" if not.  No headache.  No increased dizziness reported.  No chest pain.  EKG obtained given near syncope described.  Not orthostatic on exam.  EKG - SR with no acute ischemic changes or acute rhythm change.  Given persistence and no clear improvement with adjusting medication, will have cardiology evaluate with question of need for holter, ECHO,etc.    Orders: -     EKG 12-Lead -     CBC with Differential/Platelet -     Basic metabolic panel -     Ambulatory referral to Cardiology  Cervical cancer screening Assessment & Plan:   Saw gyn - routine exam - 08/01/22.  Has IUD in place.  PAP - normal with negative HPV.  Recommended repeat pap in 2028.  Pelvic ultrasound - fibroids  - unchanged.     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     Seizure disorder [facility]) Assessment & Plan: Followed by neurology.  Has f/u tomorrow.        Einar Pheasant, MD

## 2022-10-22 ENCOUNTER — Encounter: Payer: Self-pay | Admitting: Internal Medicine

## 2022-10-22 DIAGNOSIS — R55 Syncope and collapse: Secondary | ICD-10-CM | POA: Insufficient documentation

## 2022-10-22 NOTE — Assessment & Plan Note (Signed)
Diagnosed as a child.

## 2022-10-22 NOTE — Assessment & Plan Note (Signed)
Followed by neurology.  Has f/u tomorrow.

## 2022-10-22 NOTE — Assessment & Plan Note (Signed)
On lamictal and clonazepam.  Mother recently concerned that she was slower to respond and more "disoriented".  Felt related to medication.  Dr Melrose Nakayama decreased the clonazepam to 1/2 tablet.  The "disorientation" has improved.  Response time has improved. Has f/u with neurology tomorrow.  She has fallen recently.  On questioning, no head injury.  No residual injury from the fall.  Fell forward.  She does described that when standing - for any period of time - will feel like she needs to sit down - or will "go down" if not.  No headache.  No increased dizziness reported.  No chest pain.  EKG obtained given near syncope described.  Not orthostatic on exam.  EKG - SR with no acute ischemic changes or acute rhythm change.  Given persistence and no clear improvement with adjusting medication, will have cardiology evaluate with question of need for holter, ECHO,etc.

## 2022-10-22 NOTE — Assessment & Plan Note (Signed)
Saw gyn - routine exam - 08/01/22.  Has IUD in place.  PAP - normal with negative HPV.  Recommended repeat pap in 2028.  Pelvic ultrasound - fibroids  - unchanged.

## 2022-10-23 ENCOUNTER — Telehealth: Payer: Self-pay

## 2022-10-23 DIAGNOSIS — E876 Hypokalemia: Secondary | ICD-10-CM

## 2022-10-23 NOTE — Telephone Encounter (Signed)
Pt returning call

## 2022-10-23 NOTE — Telephone Encounter (Signed)
-----  Message from Einar Pheasant, MD sent at 10/19/2022  5:31 AM EST ----- Please call and notify her mother - potassium slightly decreased.  Send information (and inform her) of foods with increased potassium.  Would like to recheck potassium in 10-14 days.  Sodium level and hgb wnl.

## 2022-10-24 NOTE — Addendum Note (Signed)
Addended by: Roetta Sessions D on: 10/24/2022 01:52 PM   Modules accepted: Orders

## 2022-10-24 NOTE — Telephone Encounter (Signed)
Thank you for the update.  Keep [place] posted how she is doing.  I had discussed referral to cardiology for near syncope.  Do they still want to keep that appt?

## 2022-10-24 NOTE — Telephone Encounter (Signed)
Patients mom aware of results and patient scheduled for potassium recheck. Order is in. Patients mom also wanted me to let you know that she saw Dr Melrose Nakayama and he stopped clonazepam completely and patient is doing better. He is going to see her back in 4-6 weeks.

## 2022-10-25 NOTE — Telephone Encounter (Signed)
Spoke with mom. She would like to hold off on cardiology right now since patient is doing better with medication changes.

## 2022-10-31 ENCOUNTER — Other Ambulatory Visit (INDEPENDENT_AMBULATORY_CARE_PROVIDER_SITE_OTHER): Payer: 59

## 2022-10-31 DIAGNOSIS — E876 Hypokalemia: Secondary | ICD-10-CM

## 2022-10-31 LAB — POTASSIUM: Potassium: 3.5 mEq/L (ref 3.5–5.1)

## 2022-12-06 ENCOUNTER — Encounter: Payer: Self-pay | Admitting: Cardiovascular Disease

## 2022-12-06 ENCOUNTER — Ambulatory Visit: Payer: 59 | Attending: Cardiovascular Disease | Admitting: Cardiovascular Disease

## 2022-12-06 VITALS — BP 133/78 | HR 77 | Ht <= 58 in | Wt 156.4 lb

## 2022-12-06 DIAGNOSIS — G40909 Epilepsy, unspecified, not intractable, without status epilepticus: Secondary | ICD-10-CM | POA: Diagnosis not present

## 2022-12-06 DIAGNOSIS — R030 Elevated blood-pressure reading, without diagnosis of hypertension: Secondary | ICD-10-CM

## 2022-12-06 DIAGNOSIS — R55 Syncope and collapse: Secondary | ICD-10-CM | POA: Diagnosis not present

## 2022-12-06 NOTE — Patient Instructions (Addendum)
Echo for near syncope/dizziness   Medication Instructions:  No changes  If you need a refill on your cardiac medications before your next appointment, please call your pharmacy.   Lab work: No new labs needed  Testing/Procedures: No new testing needed  Follow-Up: At [facility], you and your health needs are our priority.  As part of our continuing mission to provide you with exceptional heart care, we have created designated Provider Care Teams.  These Care Teams include your primary Cardiologist (physician) and Advanced Practice Providers (APPs -  Physician Assistants and Nurse Practitioners) who all work together to provide you with the care you need, when you need it.  You will need a follow up appointment as needed  Providers on your designated Care Team:   Murray Hodgkins, NP Christell Faith, PA-C Cadence Kathlen Mody, [place]  COVID-19 Vaccine Information can be found at: [email or web address] For questions related to vaccine distribution or appointments, please email [email or web address] or call [phone].

## 2022-12-06 NOTE — Progress Notes (Signed)
Cardiology Office Note  Date:  12/06/2022   ID:  Dana Carroll, DOB 1976/05/28, MRN [id number]  PCP:  Einar Pheasant, MD   Chief Complaint  Patient presents with   New Patient (Initial Visit)    Ref by Dr. Einar Pheasant for near syncope. Patient c/o dizziness, tiredness & shortness of breath with little to no exertion.  Medications reviewed by the patient verbally.     HPI:  Dana Carroll is a 47 year old woman with past medical history of Seizures Leukemia Who presents by referral from Dr. Einar Pheasant for consultation of her near syncope, falls  Followed by neurology for seizures Mother who presents with her today reports having issues with recent medications For many years was on phenobarbital, this was weaned off Currently on Depakote and Lamictal Mother reports she was started on other medications for depression including clonazepam which made her tremendously drowsy sleepy with gait instability This was weaned down but symptoms persisted and eventually stopped February 20 Previously also tried on Lexapro but again had sedation side effects and this was held  Mother feels she is doing better just on Depakote and Lamictal, more alert, able to take care of herself  Recent fall, January, mother feels this was "definitely from medications" [place] out of bed which mother feels was from being oversedated She was having difficulty waking her up from sleep, almost like she was in a coma when standing - for any period of time - will feel like she needs to sit down - or will "go down" if not.  Not orthostatic on exam with primary care Normal EKG with primary care  No recent seizure,   At home stays active does coloring, home schooled, math and reading,  At home does treadmill #2 speed, 15 min Poor balance at baseline Able to get up from chair moved to the exam table without assistance but mild hesitancy  Orthostatics in the office today were negative blood pressure with  standing 125/83 heart rate 80, blood pressure after 3 minutes standing 130/100 heart rate 80  EKG personally reviewed by myself on todays visit Normal sinus rhythm rate 77 bpm no significant ST-T wave changes   PMH:   has a past medical history of Cancer ([place]), Depression, and Seizures ([place]).  PSH:    Past Surgical History:  Procedure Laterality Date   BREAST SURGERY     GALLBLADDER SURGERY      Current Outpatient Medications  Medication Sig Dispense Refill   divalproex (DEPAKOTE) 125 MG DR tablet TAKE 1 TABLET BY MOUTH DAILY (Patient taking differently: TAKE 1 TABLET BY MOUTH IN THE MORNING AND 2 TABLETS AT NIGHT) 90 tablet 0   fluticasone (FLONASE) 50 MCG/ACT nasal spray Place 2 sprays into both nostrils daily. 16 g 2   lamoTRIgine (LAMICTAL) 100 MG tablet Take 100 mg by mouth 2 (two) times daily.     loratadine (CLARITIN) 10 MG tablet Take 10 mg by mouth daily.     MAGNESIUM PO Take 400 mg by mouth daily.     vitamin B-12 (CYANOCOBALAMIN) 100 MCG tablet Take 100 mcg by mouth daily.     vitamin C (ASCORBIC ACID) 250 MG tablet Take 250 mg by mouth daily.     clonazePAM (KLONOPIN) 0.5 MG tablet Take 0.5 mg by mouth at bedtime. (Patient not taking: Reported on 12/06/2022)     fexofenadine (ALLEGRA ALLERGY) 60 MG tablet Take 1 tablet (60 mg total) by mouth 2 (two) times daily as needed for allergies  or rhinitis. (Patient not taking: Reported on 12/06/2022) 60 tablet 1   ketoconazole (NIZORAL) 2 % shampoo Apply 1 application topically 2 (two) times a week. (Patient not taking: Reported on 08/01/2022) 120 mL 1   No current facility-administered medications for this visit.    Allergies:   Tilactase   Social History:  The patient  reports that she has never smoked. She has never used smokeless tobacco. She reports that she does not drink alcohol and does not use drugs.   Family History:   family history includes Cancer in her brother and father; Diabetes in an other family member; Heart  failure in her mother; High blood pressure in her mother and another family member.    Review of Systems: Review of Systems  Constitutional: Negative.   HENT: Negative.    Respiratory: Negative.    Cardiovascular: Negative.   Gastrointestinal: Negative.   Musculoskeletal: Negative.   Neurological:  Positive for dizziness.  Psychiatric/Behavioral: Negative.    All other systems reviewed and are negative.   PHYSICAL EXAM: VS:  BP 133/78 (BP Location: Left Arm, Patient Position: Sitting, Cuff Size: Normal)   Pulse 77   Ht '4\' 8"'$  (1.422 m)   Wt 156 lb 6 oz (70.9 kg)   SpO2 97%   BMI 35.06 kg/m  , BMI Body mass index is 35.06 kg/m. GEN: Well nourished, well developed, in no acute distress HEENT: normal Neck: no JVD, carotid bruits, or masses Cardiac: RRR; no murmurs, rubs, or gallops,no edema  Respiratory:  clear to auscultation bilaterally, normal work of breathing GI: soft, nontender, nondistended, + BS MS: no deformity or atrophy Skin: warm and dry, no rash Neuro:  Strength and sensation are intact Psych: euthymic mood, full affect  Recent Labs: 12/09/2021: ALT 24 08/01/2022: TSH 1.200 10/18/2022: BUN 13; Creatinine, Ser 1.02; Hemoglobin 12.6; Platelets 331.0; Sodium 139 10/31/2022: Potassium 3.5    Lipid Panel Lab Results  Component Value Date   CHOL 181 08/01/2022   HDL 41 08/01/2022   LDLCALC 100 (H) 08/01/2022   TRIG 233 (H) 08/01/2022      Wt Readings from Last 3 Encounters:  12/06/22 156 lb 6 oz (70.9 kg)  10/18/22 157 lb (71.2 kg)  08/01/22 156 lb (70.8 kg)      ASSESSMENT AND PLAN:  Problem List Items Addressed This Visit       Cardiology Problems   Near syncope - Primary     Other   Seizure disorder (HCC)   Blood pressure elevated without history of HTN   Near syncope, falls, dizziness Mother concerned about cardiac etiology Normal clinical exam, normal EKG Suspect secondary to psychotropic medication Feeling better without clonazepam,  Lexapro, balance is better With that said even 2 weeks after stopping these medications, still with mild gait instability on exam today Echocardiogram ordered to rule out cardiac structural heart disease We will call her with the results.  Close suspicion of cardiac etiology She does have treadmill at home uses this for 15 minutes periodically Recommend she try to do regular treadmill and walking outside on a regular basis She would likely benefit from PT/regular exercise program  History of seizures Followed by neurology Recent excessive sedation on clonazepam, Lexapro Medications weaned off, seems to be doing better per the mother    Total encounter time more than 60 minutes  Greater than 50% was spent in counseling and coordination of care with the patient  Patient seen in consultation for Dr. Einar Pheasant and will be referred  back to her office for ongoing care of the issues detailed above  Signed, Esmond Plants, M.D., Ph.D. [place], [place]

## 2023-01-12 ENCOUNTER — Ambulatory Visit
Admission: RE | Admit: 2023-01-12 | Discharge: 2023-01-12 | Disposition: A | Discharge status: Home / Self Care | Payer: 59 | Source: Ambulatory Visit | Attending: Obstetrics | Admitting: Obstetrics

## 2023-01-12 DIAGNOSIS — Z1231 Encounter for screening mammogram for malignant neoplasm of breast: Secondary | ICD-10-CM

## 2023-01-12 HISTORY — DX: Personal history of irradiation: Z92.3

## 2023-01-12 HISTORY — DX: Personal history of antineoplastic chemotherapy: Z92.21

## 2023-01-12 HISTORY — DX: Leukemia, unspecified not having achieved remission: C95.90

## 2023-01-15 ENCOUNTER — Ambulatory Visit: Payer: 59 | Attending: Cardiovascular Disease

## 2023-01-15 DIAGNOSIS — R55 Syncope and collapse: Secondary | ICD-10-CM

## 2023-01-15 LAB — ECHOCARDIOGRAM COMPLETE
AR max vel: 1.67 cm2
AV Area VTI: 1.73 cm2
AV Area mean vel: 1.69 cm2
AV Mean grad: 7 mmHg
AV Peak grad: 13.8 mmHg
Ao pk vel: 1.86 m/s
Area-P 1/2: 3.75 cm2
Calc EF: 52.7 %
S' Lateral: 2.4 cm
Single Plane A2C EF: 50.4 %
Single Plane A4C EF: 53.2 %

## 2023-01-18 ENCOUNTER — Ambulatory Visit (INDEPENDENT_AMBULATORY_CARE_PROVIDER_SITE_OTHER): Payer: 59 | Admitting: Internal Medicine

## 2023-01-18 ENCOUNTER — Encounter: Payer: Self-pay | Admitting: Internal Medicine

## 2023-01-18 VITALS — BP 110/80 | HR 65 | Temp 97.7°F | Resp 17 | Ht <= 58 in | Wt 156.5 lb

## 2023-01-18 DIAGNOSIS — R55 Syncope and collapse: Secondary | ICD-10-CM

## 2023-01-18 DIAGNOSIS — G40909 Epilepsy, unspecified, not intractable, without status epilepticus: Secondary | ICD-10-CM | POA: Diagnosis not present

## 2023-01-18 DIAGNOSIS — C9591 Leukemia, unspecified, in remission: Secondary | ICD-10-CM

## 2023-01-18 DIAGNOSIS — F419 Anxiety disorder, unspecified: Secondary | ICD-10-CM

## 2023-01-18 DIAGNOSIS — J302 Other seasonal allergic rhinitis: Secondary | ICD-10-CM

## 2023-01-18 DIAGNOSIS — Z13220 Encounter for screening for lipoid disorders: Secondary | ICD-10-CM

## 2023-01-18 LAB — LIPID PANEL
Cholesterol: 146 mg/dL (ref 0–200)
HDL: 38.6 mg/dL — ABNORMAL LOW (ref >39.00)
LDL Cholesterol: 73 mg/dL (ref 0–99)
NonHDL: 107.79
Total CHOL/HDL Ratio: 4
Triglycerides: 173 mg/dL — ABNORMAL HIGH (ref 0.0–149.0)
VLDL: 34.6 mg/dL (ref 0.0–40.0)

## 2023-01-18 LAB — BASIC METABOLIC PANEL
BUN: 9 mg/dL (ref 6–23)
CO2: 32 mEq/L (ref 19–32)
Calcium: 9.5 mg/dL (ref 8.4–10.5)
Chloride: 100 mEq/L (ref 96–112)
Creatinine, Ser: 0.95 mg/dL (ref 0.40–1.20)
GFR: 72.56 mL/min (ref >60.00)
Glucose, Bld: 87 mg/dL (ref 70–99)
Potassium: 3.7 mEq/L (ref 3.5–5.1)
Sodium: 138 mEq/L (ref 135–145)

## 2023-01-18 LAB — HEPATIC FUNCTION PANEL
ALT: 10 U/L (ref 0–35)
AST: 14 U/L (ref 0–37)
Albumin: 4 g/dL (ref 3.5–5.2)
Alkaline Phosphatase: 59 U/L (ref 39–117)
Bilirubin, Direct: 0.1 mg/dL (ref 0.0–0.3)
Total Bilirubin: 0.3 mg/dL (ref 0.2–1.2)
Total Protein: 6.9 g/dL (ref 6.0–8.3)

## 2023-01-18 NOTE — Progress Notes (Signed)
Subjective:    Patient ID: Dana Carroll, female    DOB: 1976/09/10, 47 y.o.   MRN: [id number]  Patient here for  Chief Complaint  Patient presents with   Follow-up    3 mth f/u- had echo, mammogram, & a medication changed recently    HPI Here to follow up regarding allergies and history of seizures. Accompanied by her mother.  History obtained from both of them.  Sees neurology.  On lamictal and depakote.  Clonazepam stopped - 10/19/22.  Was started on lexapro 11/16/22 - anxiety and depression. Did not tolerate.  Started on buspar.  Tolerating buspar.  Still feels that focus is an issue.  Just started.  Continues to f/u with neurology.  Also saw cardiology - f/u regarding near syncope.  ECHO ok.  No further cardiac w/up felt warranted.  No chest pain reported.  Breathing stable.  No increased cough or congestion.  No abdominal pain. Saw gyn - routine exam - 08/01/22. Has IUD in place. PAP - normal with negative HPV. Recommended repeat pap in 2028. Found to have elevated triglycerides. Diet and exercise. Pelvic ultrasound - fibroids - unchanged. Mammogram 01/12/23 - [facility].   Past Medical History:  Diagnosis Date   Cancer    Depression    Leukemia    Personal history of chemotherapy    Personal history of radiation therapy    Seizures    Past Surgical History:  Procedure Laterality Date   BREAST SURGERY     GALLBLADDER SURGERY     REDUCTION MAMMAPLASTY Bilateral 06/2010   Family History  Problem Relation Age of Onset   High blood pressure Mother    Heart failure Mother        09/2016   Cancer Father    Cancer Brother    Diabetes Other    High blood pressure Other    Social History   Socioeconomic History   Marital status: Single    Spouse name: Not on file   Number of children: Not on file   Years of education: HS Grad   Highest education level: Not on file  Occupational History   Occupation: Unemployed   Tobacco Use   Smoking status: Never   Smokeless tobacco:  Never  Vaping Use   Vaping Use: Never used  Substance and Sexual Activity   Alcohol use: No   Drug use: No   Sexual activity: Not Currently  Other Topics Concern   Not on file  Social History Narrative   Patient lives at home with boyfriend, Pamala Duffel   Patient does not work.    Patient is single.    Patient drinks caffeine rare.    Social Determinants of Health   Financial Resource Strain: Not on file  Food Insecurity: Not on file  Transportation Needs: Not on file  Physical Activity: Not on file  Stress: Not on file  Social Connections: Not on file     Review of Systems  Constitutional:  Negative for appetite change and unexpected weight change.  HENT:  Negative for congestion and sinus pressure.   Respiratory:  Negative for cough, chest tightness and shortness of breath.   Cardiovascular:  Negative for chest pain and palpitations.  Gastrointestinal:  Negative for abdominal pain, diarrhea, nausea and vomiting.  Genitourinary:  Negative for difficulty urinating and dysuria.  Musculoskeletal:  Negative for joint swelling and myalgias.  Skin:  Negative for color change and rash.  Neurological:  Negative for dizziness and headaches.  Psychiatric/Behavioral:  Negative for agitation and dysphoric mood.        Still some issues with focus.  Discussed anxiety.         Objective:     BP 110/80   Pulse 65   Temp 97.7 F (36.5 C) (Oral)   Resp 17   Ht 4\' 8"  (1.422 m)   Wt 156 lb 8 oz (71 kg)   SpO2 98%   BMI 35.09 kg/m  Wt Readings from Last 3 Encounters:  01/18/23 156 lb 8 oz (71 kg)  12/06/22 156 lb 6 oz (70.9 kg)  10/18/22 157 lb (71.2 kg)    Physical Exam Vitals reviewed.  Constitutional:      General: She is not in acute distress.    Appearance: Normal appearance.  HENT:     Head: Normocephalic and atraumatic.     Right Ear: External ear normal.     Left Ear: External ear normal.  Eyes:     General: No scleral icterus.       Right eye: No discharge.         Left eye: No discharge.     Conjunctiva/sclera: Conjunctivae normal.  Neck:     Thyroid: No thyromegaly.  Cardiovascular:     Rate and Rhythm: Normal rate and regular rhythm.  Pulmonary:     Effort: No respiratory distress.     Breath sounds: Normal breath sounds. No wheezing.  Abdominal:     General: Bowel sounds are normal.     Palpations: Abdomen is soft.     Tenderness: There is no abdominal tenderness.  Musculoskeletal:        General: No swelling or tenderness.     Cervical back: Neck supple. No tenderness.  Lymphadenopathy:     Cervical: No cervical adenopathy.  Skin:    Findings: No erythema or rash.  Neurological:     Mental Status: She is alert.  Psychiatric:        Mood and Affect: Mood normal.        Behavior: Behavior normal.      Outpatient Encounter Medications as of 01/18/2023  Medication Sig   busPIRone (BUSPAR) 5 MG tablet Take 1 tablet by mouth 2 (two) times daily.   divalproex (DEPAKOTE) 125 MG DR tablet TAKE 1 TABLET BY MOUTH DAILY (Patient taking differently: TAKE 1 TABLET BY MOUTH IN THE MORNING AND 2 TABLETS AT NIGHT)   fluticasone (FLONASE) 50 MCG/ACT nasal spray Place 2 sprays into both nostrils daily.   lamoTRIgine (LAMICTAL) 100 MG tablet Take 100 mg by mouth 2 (two) times daily.   loratadine (CLARITIN) 10 MG tablet Take 10 mg by mouth daily.   MAGNESIUM PO Take 400 mg by mouth daily.   vitamin B-12 (CYANOCOBALAMIN) 100 MCG tablet Take 100 mcg by mouth daily.   vitamin C (ASCORBIC ACID) 250 MG tablet Take 250 mg by mouth daily.   [DISCONTINUED] clonazePAM (KLONOPIN) 0.5 MG tablet Take 0.5 mg by mouth at bedtime. (Patient not taking: Reported on 12/06/2022)   [DISCONTINUED] fexofenadine (ALLEGRA ALLERGY) 60 MG tablet Take 1 tablet (60 mg total) by mouth 2 (two) times daily as needed for allergies or rhinitis. (Patient not taking: Reported on 12/06/2022)   [DISCONTINUED] ketoconazole (NIZORAL) 2 % shampoo Apply 1 application topically 2 (two) times a  week. (Patient not taking: Reported on 08/01/2022)   No facility-administered encounter medications on file as of 01/18/2023.     Lab Results  Component Value Date   WBC 8.1 10/18/2022  HGB 12.6 10/18/2022   HCT 37.4 10/18/2022   PLT 331.0 10/18/2022   GLUCOSE 87 01/18/2023   CHOL 146 01/18/2023   TRIG 173.0 (H) 01/18/2023   HDL 38.60 (L) 01/18/2023   LDLCALC 73 01/18/2023   ALT 10 01/18/2023   AST 14 01/18/2023   NA 138 01/18/2023   K 3.7 01/18/2023   CL 100 01/18/2023   CREATININE 0.95 01/18/2023   BUN 9 01/18/2023   CO2 32 01/18/2023   TSH 1.200 08/01/2022   HGBA1C 5.3 08/01/2022    MM 3D SCREEN BREAST BILATERAL  Result Date: 01/17/2023 CLINICAL DATA:  Screening. EXAM: DIGITAL SCREENING BILATERAL MAMMOGRAM WITH TOMOSYNTHESIS AND CAD TECHNIQUE: Bilateral screening digital craniocaudal and mediolateral oblique mammograms were obtained. Bilateral screening digital breast tomosynthesis was performed. The images were evaluated with computer-aided detection. COMPARISON:  Previous exam(s). ACR Breast Density Category a: The breasts are almost entirely fatty. FINDINGS: There are no findings suspicious for malignancy. IMPRESSION: No mammographic evidence of malignancy. A result letter of this screening mammogram will be mailed directly to the patient. RECOMMENDATION: Screening mammogram in one year. (Code:SM-B-01Y) BI-RADS CATEGORY  1: Negative. Electronically Signed   By: Frederico Hamman M.D.   On: 01/17/2023 12:20       Assessment & Plan:  Seizure disorder Assessment & Plan: Followed by neurology.  Continue lamictal and depakote.  Follow.  Stable.   Orders: -     Basic metabolic panel -     Hepatic function panel  Screening cholesterol level -     Lipid panel  Leukemia in remission, unspecified leukemia type Assessment & Plan: Diagnosed as a child.  Mother to return form for me to review.     Near syncope Assessment & Plan: Doing better off clonazepam.  Saw Dr  Mariah Milling.  ECHO - ok.  Overall doing better.  Follow.    Seasonal allergies Assessment & Plan: Stable.    Anxiety Assessment & Plan: Increased anxiety - as outlined.  On buspar now.  Doing better.  Just started.  Follow.       Dale Henlawson, MD

## 2023-01-21 ENCOUNTER — Encounter: Payer: Self-pay | Admitting: Internal Medicine

## 2023-01-21 DIAGNOSIS — F419 Anxiety disorder, unspecified: Secondary | ICD-10-CM | POA: Insufficient documentation

## 2023-01-21 NOTE — Assessment & Plan Note (Signed)
Doing better off clonazepam.  Saw Dr Mariah Milling.  ECHO - ok.  Overall doing better.  Follow.

## 2023-01-21 NOTE — Assessment & Plan Note (Signed)
Diagnosed as a child.  Mother to return form for me to review.

## 2023-01-21 NOTE — Assessment & Plan Note (Signed)
Stable

## 2023-01-21 NOTE — Assessment & Plan Note (Signed)
Increased anxiety - as outlined.  On buspar now.  Doing better.  Just started.  Follow.

## 2023-01-21 NOTE — Assessment & Plan Note (Signed)
Followed by neurology.  Continue lamictal and depakote.  Follow.  Stable.

## 2023-04-18 DIAGNOSIS — R569 Unspecified convulsions: Secondary | ICD-10-CM | POA: Diagnosis not present

## 2023-05-22 ENCOUNTER — Ambulatory Visit: Payer: 59 | Admitting: Internal Medicine

## 2023-06-19 ENCOUNTER — Encounter: Payer: Self-pay | Admitting: Internal Medicine

## 2023-06-19 ENCOUNTER — Ambulatory Visit (INDEPENDENT_AMBULATORY_CARE_PROVIDER_SITE_OTHER): Payer: 59 | Admitting: Internal Medicine

## 2023-06-19 VITALS — BP 120/72 | HR 70 | Temp 98.3°F | Ht <= 58 in | Wt 153.4 lb

## 2023-06-19 DIAGNOSIS — R55 Syncope and collapse: Secondary | ICD-10-CM

## 2023-06-19 DIAGNOSIS — C9591 Leukemia, unspecified, in remission: Secondary | ICD-10-CM | POA: Diagnosis not present

## 2023-06-19 DIAGNOSIS — F419 Anxiety disorder, unspecified: Secondary | ICD-10-CM

## 2023-06-19 DIAGNOSIS — Z124 Encounter for screening for malignant neoplasm of cervix: Secondary | ICD-10-CM | POA: Diagnosis not present

## 2023-06-19 DIAGNOSIS — G40909 Epilepsy, unspecified, not intractable, without status epilepticus: Secondary | ICD-10-CM

## 2023-06-19 NOTE — Progress Notes (Signed)
Subjective:    Patient ID: Dana Carroll, female    DOB: September 08, 1976, 47 y.o.   MRN: [id number]  Patient here for  Chief Complaint  Patient presents with   Medical Management of Chronic Issues    HPI Here to follow up regarding allergies and history of seizures. Accompanied by her mother. History obtained from both of them. Sees neurology. On lamictal and depakote. Clonazepam stopped - 10/19/22. Was started on lexapro 11/16/22 - anxiety and depression. Did not tolerate. Started on buspar. Tolerating buspar. Also saw cardiology - f/u regarding near syncope. ECHO ok. No further cardiac w/up felt warranted.  Saw gyn - routine exam - 08/01/22. Has IUD in place. PAP - normal with negative HPV. Recommended repeat pap in 2028.  Saw Dr Malvin Johns 04/18/23.  Per report - having episodes of confusion 2-3x/week. Imbalance. Started keppra. Continue lamictal and depakote. The "spells" have improved.  No report of confusion episodes currently. Mother is concerned regarding medication changes.  Request to see new neurologist for evaluation.  She is eating.  No vomiting reported.  Breathing stable.    Past Medical History:  Diagnosis Date   Cancer (HCC)    Depression    Leukemia (HCC)    Personal history of chemotherapy    Personal history of radiation therapy    Seizures (HCC)    Past Surgical History:  Procedure Laterality Date   BREAST SURGERY     GALLBLADDER SURGERY     REDUCTION MAMMAPLASTY Bilateral 06/2010   Family History  Problem Relation Age of Onset   High blood pressure Mother    Heart failure Mother        09/2016   Cancer Father    Cancer Brother    Diabetes Other    High blood pressure Other    Social History   Socioeconomic History   Marital status: Single    Spouse name: Not on file   Number of children: Not on file   Years of education: HS Grad   Highest education level: Not on file  Occupational History   Occupation: Unemployed   Tobacco Use   Smoking status: Never    Smokeless tobacco: Never  Vaping Use   Vaping status: Never Used  Substance and Sexual Activity   Alcohol use: No   Drug use: No   Sexual activity: Not Currently  Other Topics Concern   Not on file  Social History Narrative   Patient lives at home with boyfriend, Pamala Duffel   Patient does not work.    Patient is single.    Patient drinks caffeine rare.    Social Determinants of Health   Financial Resource Strain: Not on file  Food Insecurity: Not on file  Transportation Needs: Not on file  Physical Activity: Not on file  Stress: Not on file  Social Connections: Not on file     Review of Systems  Constitutional:  Negative for appetite change and unexpected weight change.  HENT:  Negative for congestion and sinus pressure.   Respiratory:  Negative for cough, chest tightness and shortness of breath.   Cardiovascular:  Negative for chest pain and palpitations.  Gastrointestinal:  Negative for abdominal pain, diarrhea, nausea and vomiting.  Genitourinary:  Negative for difficulty urinating and dysuria.  Musculoskeletal:  Negative for joint swelling and myalgias.  Skin:  Negative for color change and rash.  Neurological:  Negative for dizziness and headaches.  Psychiatric/Behavioral:  Negative for agitation and dysphoric mood.  Objective:     BP 120/72   Pulse 70   Temp 98.3 F (36.8 C) (Oral)   Ht 4\' 8"  (1.422 m)   Wt 153 lb 6.4 oz (69.6 kg)   SpO2 97%   BMI 34.39 kg/m  Wt Readings from Last 3 Encounters:  06/19/23 153 lb 6.4 oz (69.6 kg)  01/18/23 156 lb 8 oz (71 kg)  12/06/22 156 lb 6 oz (70.9 kg)    Physical Exam Vitals reviewed.  Constitutional:      General: She is not in acute distress.    Appearance: Normal appearance.  HENT:     Head: Normocephalic and atraumatic.     Right Ear: External ear normal.     Left Ear: External ear normal.  Eyes:     General: No scleral icterus.       Right eye: No discharge.        Left eye: No discharge.      Conjunctiva/sclera: Conjunctivae normal.  Neck:     Thyroid: No thyromegaly.  Cardiovascular:     Rate and Rhythm: Normal rate and regular rhythm.  Pulmonary:     Effort: No respiratory distress.     Breath sounds: Normal breath sounds. No wheezing.  Abdominal:     General: Bowel sounds are normal.     Palpations: Abdomen is soft.     Tenderness: There is no abdominal tenderness.  Musculoskeletal:        General: No swelling or tenderness.     Cervical back: Neck supple. No tenderness.  Lymphadenopathy:     Cervical: No cervical adenopathy.  Skin:    Findings: No erythema or rash.  Neurological:     Mental Status: She is alert.  Psychiatric:        Mood and Affect: Mood normal.        Behavior: Behavior normal.      Outpatient Encounter Medications as of 06/19/2023  Medication Sig   busPIRone (BUSPAR) 5 MG tablet Take 1 tablet by mouth 2 (two) times daily.   divalproex (DEPAKOTE) 125 MG DR tablet TAKE 1 TABLET BY MOUTH DAILY (Patient taking differently: TAKE 1 TABLET BY MOUTH IN THE MORNING AND 2 TABLETS AT NIGHT)   fluticasone (FLONASE) 50 MCG/ACT nasal spray Place 2 sprays into both nostrils daily.   lamoTRIgine (LAMICTAL) 100 MG tablet Take 100 mg by mouth daily.   lamoTRIgine (LAMICTAL) 25 MG tablet Take 75 mg by mouth every morning.   levETIRAcetam (KEPPRA) 500 MG tablet Take 250 mg by mouth daily.   loratadine (CLARITIN) 10 MG tablet Take 10 mg by mouth daily.   MAGNESIUM PO Take 400 mg by mouth daily.   vitamin B-12 (CYANOCOBALAMIN) 100 MCG tablet Take 100 mcg by mouth daily.   vitamin C (ASCORBIC ACID) 250 MG tablet Take 250 mg by mouth daily.   No facility-administered encounter medications on file as of 06/19/2023.     Lab Results  Component Value Date   WBC 8.1 10/18/2022   HGB 12.6 10/18/2022   HCT 37.4 10/18/2022   PLT 331.0 10/18/2022   GLUCOSE 87 01/18/2023   CHOL 146 01/18/2023   TRIG 173.0 (H) 01/18/2023   HDL 38.60 (L) 01/18/2023   LDLCALC 73  01/18/2023   ALT 10 01/18/2023   AST 14 01/18/2023   NA 138 01/18/2023   K 3.7 01/18/2023   CL 100 01/18/2023   CREATININE 0.95 01/18/2023   BUN 9 01/18/2023   CO2 32 01/18/2023   TSH  1.200 08/01/2022   HGBA1C 5.3 08/01/2022    MM 3D SCREEN BREAST BILATERAL  Result Date: 01/17/2023 CLINICAL DATA:  Screening. EXAM: DIGITAL SCREENING BILATERAL MAMMOGRAM WITH TOMOSYNTHESIS AND CAD TECHNIQUE: Bilateral screening digital craniocaudal and mediolateral oblique mammograms were obtained. Bilateral screening digital breast tomosynthesis was performed. The images were evaluated with computer-aided detection. COMPARISON:  Previous exam(s). ACR Breast Density Category a: The breasts are almost entirely fatty. FINDINGS: There are no findings suspicious for malignancy. IMPRESSION: No mammographic evidence of malignancy. A result letter of this screening mammogram will be mailed directly to the patient. RECOMMENDATION: Screening mammogram in one year. (Code:SM-B-01Y) BI-RADS CATEGORY  1: Negative. Electronically Signed   By: Frederico Hamman M.D.   On: 01/17/2023 12:20       Assessment & Plan:  Anxiety Assessment & Plan: Increased anxiety - as outlined.  On buspar now.  Appears to be doing better.  Follow.    Cervical cancer screening Assessment & Plan:   Saw gyn - routine exam - 08/01/22.  Has IUD in place.  PAP - normal with negative HPV.  Recommended repeat pap in 2028.  Pelvic ultrasound - fibroids  - unchanged.     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     Near syncope Assessment & Plan: Doing better off clonazepam.  Saw Dr Mariah Milling.  ECHO - ok.  No further near syncopal episodes. Follow.    Seizure disorder [facility]) Assessment & Plan: Followed by neurology.  Continues on lamictal and depakote.  Now on keppra. Request referral to neurology - for evaluation.  Request to change neurologist.    Orders: -     Ambulatory referral to  Neurology     Dale Jena, MD

## 2023-06-24 ENCOUNTER — Encounter: Payer: Self-pay | Admitting: Internal Medicine

## 2023-06-24 NOTE — Assessment & Plan Note (Signed)
Saw gyn - routine exam - 08/01/22.  Has IUD in place.  PAP - normal with negative HPV.  Recommended repeat pap in 2028.  Pelvic ultrasound - fibroids  - unchanged.

## 2023-06-24 NOTE — Assessment & Plan Note (Signed)
Increased anxiety - as outlined.  On buspar now.  Appears to be doing better.  Follow.

## 2023-06-24 NOTE — Assessment & Plan Note (Signed)
Followed by neurology.  Continues on lamictal and depakote.  Now on keppra. Request referral to neurology - for evaluation.  Request to change neurologist.

## 2023-06-24 NOTE — Assessment & Plan Note (Signed)
Diagnosed as a child.

## 2023-06-24 NOTE — Assessment & Plan Note (Signed)
Doing better off clonazepam.  Saw Dr Mariah Milling.  ECHO - ok.  No further near syncopal episodes. Follow.

## 2023-06-27 ENCOUNTER — Encounter: Payer: Self-pay | Admitting: Neurology

## 2023-07-18 DIAGNOSIS — R251 Tremor, unspecified: Secondary | ICD-10-CM | POA: Diagnosis not present

## 2023-07-18 DIAGNOSIS — R569 Unspecified convulsions: Secondary | ICD-10-CM | POA: Diagnosis not present

## 2023-07-18 DIAGNOSIS — G9349 Other encephalopathy: Secondary | ICD-10-CM | POA: Diagnosis not present

## 2023-07-18 DIAGNOSIS — R202 Paresthesia of skin: Secondary | ICD-10-CM | POA: Diagnosis not present

## 2023-07-18 DIAGNOSIS — R200 Anesthesia of skin: Secondary | ICD-10-CM | POA: Diagnosis not present

## 2023-07-18 DIAGNOSIS — Z79899 Other long term (current) drug therapy: Secondary | ICD-10-CM | POA: Diagnosis not present

## 2023-07-23 ENCOUNTER — Other Ambulatory Visit: Payer: Self-pay | Admitting: Neurology

## 2023-07-23 DIAGNOSIS — R569 Unspecified convulsions: Secondary | ICD-10-CM

## 2023-07-24 ENCOUNTER — Ambulatory Visit
Admission: RE | Admit: 2023-07-24 | Discharge: 2023-07-24 | Disposition: A | Discharge status: Home / Self Care | Payer: 59 | Source: Ambulatory Visit | Attending: Neurology | Admitting: Neurology

## 2023-07-24 DIAGNOSIS — R9089 Other abnormal findings on diagnostic imaging of central nervous system: Secondary | ICD-10-CM | POA: Diagnosis not present

## 2023-07-24 DIAGNOSIS — R569 Unspecified convulsions: Secondary | ICD-10-CM | POA: Diagnosis not present

## 2023-07-25 ENCOUNTER — Other Ambulatory Visit: Payer: Self-pay | Admitting: Neurology

## 2023-07-25 DIAGNOSIS — R9089 Other abnormal findings on diagnostic imaging of central nervous system: Secondary | ICD-10-CM

## 2023-07-25 DIAGNOSIS — R569 Unspecified convulsions: Secondary | ICD-10-CM

## 2023-07-26 ENCOUNTER — Ambulatory Visit: Admission: RE | Admit: 2023-07-26 | Payer: 59 | Source: Ambulatory Visit

## 2023-07-27 ENCOUNTER — Ambulatory Visit
Admission: RE | Admit: 2023-07-27 | Discharge: 2023-07-27 | Disposition: A | Discharge status: Home / Self Care | Payer: 59 | Source: Ambulatory Visit | Attending: Neurology | Admitting: Neurology

## 2023-07-27 DIAGNOSIS — R569 Unspecified convulsions: Secondary | ICD-10-CM | POA: Insufficient documentation

## 2023-07-27 DIAGNOSIS — R9089 Other abnormal findings on diagnostic imaging of central nervous system: Secondary | ICD-10-CM | POA: Insufficient documentation

## 2023-07-27 MED ORDER — GADOBUTROL 1 MMOL/ML IV SOLN
7.0000 mL | Freq: Once | INTRAVENOUS | Status: AC | PRN
Start: 2023-07-27 — End: 2023-07-27
  Administered 2023-07-27: 7 mL via INTRAVENOUS

## 2023-08-06 DIAGNOSIS — R2681 Unsteadiness on feet: Secondary | ICD-10-CM | POA: Diagnosis not present

## 2023-08-08 ENCOUNTER — Ambulatory Visit: Payer: 59 | Admitting: Neurology

## 2023-08-10 DIAGNOSIS — R2681 Unsteadiness on feet: Secondary | ICD-10-CM | POA: Diagnosis not present

## 2023-08-15 DIAGNOSIS — R2681 Unsteadiness on feet: Secondary | ICD-10-CM | POA: Diagnosis not present

## 2023-08-29 ENCOUNTER — Telehealth: Payer: Self-pay | Admitting: Internal Medicine

## 2023-08-29 DIAGNOSIS — R2681 Unsteadiness on feet: Secondary | ICD-10-CM | POA: Diagnosis not present

## 2023-08-29 NOTE — Telephone Encounter (Signed)
Lft vm on pt mom vm to call ofc regarding referral. thanks

## 2023-09-07 ENCOUNTER — Telehealth: Payer: Self-pay | Admitting: Internal Medicine

## 2023-09-07 DIAGNOSIS — R2681 Unsteadiness on feet: Secondary | ICD-10-CM | POA: Diagnosis not present

## 2023-09-07 NOTE — Telephone Encounter (Signed)
error 

## 2023-09-17 ENCOUNTER — Telehealth: Payer: Self-pay | Admitting: Internal Medicine

## 2023-09-17 NOTE — Telephone Encounter (Signed)
Patient's mother dropped off paper work to be completed by Dr Lorin Picket, Forms are up front in color folder.

## 2023-09-18 NOTE — Telephone Encounter (Signed)
I have scheduled her for a visit with you to complete but please look at these forms prior to visit to confirm you are ok with completing

## 2023-09-18 NOTE — Telephone Encounter (Signed)
Appt scheduled

## 2023-10-03 NOTE — Progress Notes (Signed)
 Subjective:    Patient ID: Dana Carroll, female    DOB: Jul 09, 1976, 48 y.o.   MRN: [id number]  Patient here for  Chief Complaint  Patient presents with   Form Completion    HPI Here as a work in for form completion. Dana Carroll was diagnosed as a child with leukemia. After treatment, she developed seizures. Also has a history of a stroke. Has a learning disability and required special needs classes. Her mother is her primary caretaker. She has to distribute her medications to her daily to assure she takes them and takes them correctly. Steve is not able to cook or drive a car. Safety is an issue. Repetitive behavior. Involved -  [employer] and has helped her with school - at home. Dana Carroll has issues with tremors which makes it difficult fo rher to hold cups and utensils and button clothes. Also reports intermittent dizziness and imbalance. Seeing neurology. On medications as outlined. She has tried to get her in a job program, but it is difficult for [employer] to follow directions.    Past Medical History:  Diagnosis Date   Cancer (HCC)    Depression    Leukemia (HCC)    Personal history of chemotherapy    Personal history of radiation therapy    Seizures (HCC)    Past Surgical History:  Procedure Laterality Date   BREAST SURGERY     GALLBLADDER SURGERY     REDUCTION MAMMAPLASTY Bilateral 06/2010   Family History  Problem Relation Age of Onset   High blood pressure Mother    Heart failure Mother        09/2016   Cancer Father    Cancer Brother    Diabetes Other    High blood pressure Other    Social History   Socioeconomic History   Marital status: Single    Spouse name: Not on file   Number of children: Not on file   Years of education: HS Grad   Highest education level: Not on file  Occupational History   Occupation: Unemployed   Tobacco Use   Smoking status: Never   Smokeless tobacco: Never  Vaping Use   Vaping status: Never Used  Substance and Sexual  Activity   Alcohol use: No   Drug use: No   Sexual activity: Not Currently  Other Topics Concern   Not on file  Social History Narrative   Patient lives at home with boyfriend, Florina   Patient does not work.    Patient is single.    Patient drinks caffeine rare.    Social Drivers of Corporate Investment Banker Strain: Not on file  Food Insecurity: Not on file  Transportation Needs: Not on file  Physical Activity: Not on file  Stress: Not on file  Social Connections: Not on file     Review of Systems  Constitutional:  Negative for appetite change and unexpected weight change.  HENT:  Negative for sinus pressure.   Respiratory:  Negative for cough, chest tightness and shortness of breath.   Cardiovascular:  Negative for chest pain and palpitations.  Gastrointestinal:  Negative for abdominal pain, diarrhea, nausea and vomiting.  Genitourinary:  Negative for difficulty urinating and dysuria.  Musculoskeletal:  Negative for joint swelling and myalgias.  Skin:  Negative for color change and rash.  Neurological:        Some tremors.  Occasional imbalance.   Psychiatric/Behavioral:  Negative for agitation and dysphoric mood.  Objective:     BP 116/72   Pulse 80   Temp 98 F (36.7 C)   Resp 16   Ht 4' 8 (1.422 m)   Wt 154 lb 12.8 oz (70.2 kg)   SpO2 98%   BMI 34.71 kg/m  Wt Readings from Last 3 Encounters:  10/04/23 154 lb 12.8 oz (70.2 kg)  06/19/23 153 lb 6.4 oz (69.6 kg)  01/18/23 156 lb 8 oz (71 kg)    Physical Exam Vitals reviewed.  Constitutional:      General: She is not in acute distress.    Appearance: Normal appearance.  HENT:     Head: Normocephalic and atraumatic.     Right Ear: External ear normal.     Left Ear: External ear normal.  Eyes:     General: No scleral icterus.       Right eye: No discharge.        Left eye: No discharge.     Conjunctiva/sclera: Conjunctivae normal.  Neck:     Thyroid : No thyromegaly.  Cardiovascular:      Rate and Rhythm: Normal rate and regular rhythm.  Pulmonary:     Effort: No respiratory distress.     Breath sounds: Normal breath sounds. No wheezing.  Abdominal:     General: Bowel sounds are normal.     Palpations: Abdomen is soft.     Tenderness: There is no abdominal tenderness.  Musculoskeletal:        General: No swelling or tenderness.     Cervical back: Neck supple. No tenderness.  Lymphadenopathy:     Cervical: No cervical adenopathy.  Skin:    Findings: No erythema or rash.  Neurological:     Mental Status: She is alert.  Psychiatric:        Mood and Affect: Mood normal.        Behavior: Behavior normal.      Outpatient Encounter Medications as of 10/04/2023  Medication Sig   lamoTRIgine  (LAMICTAL ) 150 MG tablet Take 150 mg by mouth daily.   busPIRone (BUSPAR) 5 MG tablet Take 1 tablet by mouth 2 (two) times daily.   divalproex  (DEPAKOTE ) 125 MG DR tablet TAKE 1 TABLET BY MOUTH DAILY (Patient taking differently: TAKE 1 TABLET BY MOUTH IN THE MORNING AND 2 TABLETS AT NIGHT)   fluticasone  (FLONASE ) 50 MCG/ACT nasal spray Place 2 sprays into both nostrils daily.   lacosamide (VIMPAT) 50 MG TABS tablet Take 50 mg by mouth.   lamoTRIgine  (LAMICTAL ) 25 MG tablet Take 75 mg by mouth every morning.   levETIRAcetam (KEPPRA) 500 MG tablet Take 250 mg by mouth daily.   loratadine (CLARITIN) 10 MG tablet Take 10 mg by mouth daily.   MAGNESIUM PO Take 400 mg by mouth daily.   vitamin B-12 (CYANOCOBALAMIN) 100 MCG tablet Take 100 mcg by mouth daily.   vitamin C (ASCORBIC ACID) 250 MG tablet Take 250 mg by mouth daily.   [DISCONTINUED] fluticasone  (FLONASE ) 50 MCG/ACT nasal spray Place 2 sprays into both nostrils daily.   [DISCONTINUED] lamoTRIgine  (LAMICTAL ) 100 MG tablet Take 100 mg by mouth daily.   No facility-administered encounter medications on file as of 10/04/2023.     Lab Results  Component Value Date   WBC 8.1 10/18/2022   HGB 12.6 10/18/2022   HCT 37.4 10/18/2022    PLT 331.0 10/18/2022   GLUCOSE 87 01/18/2023   CHOL 146 01/18/2023   TRIG 173.0 (H) 01/18/2023   HDL 38.60 (L) 01/18/2023  LDLCALC 73 01/18/2023   ALT 10 01/18/2023   AST 14 01/18/2023   NA 138 01/18/2023   K 3.7 01/18/2023   CL 100 01/18/2023   CREATININE 0.95 01/18/2023   BUN 9 01/18/2023   CO2 32 01/18/2023   TSH 1.200 08/01/2022   HGBA1C 5.3 08/01/2022    MR BRAIN W CONTRAST Result Date: 07/27/2023 CLINICAL DATA:  Provided history: Abnormal brain MRI.  Seizures. EXAM: MRI HEAD WITH CONTRAST TECHNIQUE: Multiplanar, multiecho pulse sequences of the brain and surrounding structures were obtained with intravenous contrast. CONTRAST:  7mL GADAVIST  GADOBUTROL  1 MMOL/ML IV SOLN COMPARISON:  None. FINDINGS: A contrast-enhanced brain MRI was performed as a follow-up to the recent prior non-contrast brain MRI of 07/24/2023. The following sequences were acquired for today's examination: Axial 3D pre-contrast T1, coronal T2, axial 3D T1 post-contrast, coronal T1 post-contrast, sagittal T1 post-contrast. Brain: The 5 mm rounded focus along the mid falx described on the recent prior brain MRI demonstrates homogeneous enhancement and is most consistent with a small meningioma (series 7, image 117). There is no significant mass effect upon the adjacent brain parenchyma. No adjacent parenchymal edema. No pathologic intracranial enhancement identified elsewhere. Vascular: Maintained flow voids within the proximal large arterial vessels. Enhancement of the proximal large arterial vessels and dural venous sinuses. Skull and upper cervical spine: No focal worrisome marrow lesion. Sinuses/Orbits: No pathologic orbital enhancement. No significant paranasal sinus disease. These results will be called to the ordering clinician or representative by the Radiologist Assistant, and communication documented in the PACS or [employer]. IMPRESSION: The 5 mm rounded focus along the mid falx described on the recent  prior brain MRI of 07/24/2023 demonstrates homogeneous enhancement. This is most consistent with a meningioma. There is no significant mass effect upon the adjacent brain parenchyma. No adjacent parenchymal edema. Electronically Signed   By: Rockey Childs D.O.   On: 07/27/2023 20:20       Assessment & Plan:  Seizure disorder [facility]) Assessment & Plan: Followed by neurology.  Continues on lamictal  and depakote .  Now on keppra.  Mother has to distribute her medications to assure she is taking them and taking correctly.    Seasonal allergies Assessment & Plan: Flonase  as directed.   Orders: -     Fluticasone  Propionate; Place 2 sprays into both nostrils daily.  Dispense: 16 g; Refill: 2  Leukemia in remission, unspecified leukemia type (HCC) Assessment & Plan: Diagnosed as a child as outlined.     Learning disability Assessment & Plan: Requires direct supervision as outlined.  Mother has to help her with ADLs as outlined.  Also has to assure taking medications correctly.    Encounter for completion of form with patient Assessment & Plan: Community alternatives form completed (for disabled adults).       Allena Hamilton, MD

## 2023-10-04 ENCOUNTER — Ambulatory Visit (INDEPENDENT_AMBULATORY_CARE_PROVIDER_SITE_OTHER): Payer: 59 | Admitting: Internal Medicine

## 2023-10-04 VITALS — BP 116/72 | HR 80 | Temp 98.0°F | Resp 16 | Ht <= 58 in | Wt 154.8 lb

## 2023-10-04 DIAGNOSIS — C9591 Leukemia, unspecified, in remission: Secondary | ICD-10-CM

## 2023-10-04 DIAGNOSIS — G40909 Epilepsy, unspecified, not intractable, without status epilepticus: Secondary | ICD-10-CM | POA: Diagnosis not present

## 2023-10-04 DIAGNOSIS — J302 Other seasonal allergic rhinitis: Secondary | ICD-10-CM | POA: Diagnosis not present

## 2023-10-04 DIAGNOSIS — F819 Developmental disorder of scholastic skills, unspecified: Secondary | ICD-10-CM | POA: Diagnosis not present

## 2023-10-04 DIAGNOSIS — Z0289 Encounter for other administrative examinations: Secondary | ICD-10-CM | POA: Diagnosis not present

## 2023-10-04 MED ORDER — FLUTICASONE PROPIONATE 50 MCG/ACT NA SUSP
2.0000 | Freq: Every day | NASAL | 2 refills | Status: DC
Start: 2023-10-04 — End: 2023-12-31

## 2023-10-08 ENCOUNTER — Telehealth: Payer: Self-pay

## 2023-10-08 ENCOUNTER — Other Ambulatory Visit: Payer: Self-pay | Admitting: Internal Medicine

## 2023-10-08 NOTE — Telephone Encounter (Signed)
 Called patients mom to confirm if they are seeing Neurology. They have an appt on the 15th. Mom says that this was discussed at her appt. Pt is out of medication and they have been unable to reach neurology for refill. Ok to refill short supply until her appt?

## 2023-10-08 NOTE — Telephone Encounter (Signed)
 Copied from CRM [id number]. Topic: Clinical - Medication Refill >> Oct 08, 2023 11:53 AM Rolin D wrote: Most Recent Primary Care Visit:  Provider: SCOTT, CHARLENE  Department: [facility]  Visit Type: OFFICE VISIT  Date: 10/04/2023  Medication: lacosamide (VIMPAT) 50 MG TABS tablet  Has the patient contacted their pharmacy? No (Agent: If no, request that the patient contact the pharmacy for the refill. If patient does not wish to contact the pharmacy document the reason why and proceed with request.) (Agent: If yes, when and what did the pharmacy advise?)  Is this the correct pharmacy for this prescription? Yes If no, delete pharmacy and type the correct one.  This is the patient's preferred pharmacy:  [facility] [phone] [facility] [place] - [address] [place] [address] Phone: [phone] Fax: [phone]   Has the prescription been filled recently? No  Is the patient out of the medication? Yes  Has the patient been seen for an appointment in the last year OR does the patient have an upcoming appointment? Yes  Can we respond through MyChart? Yes  Agent: Please be advised that Rx refills may take up to 3 business days. We ask that you follow-up with your pharmacy.

## 2023-10-08 NOTE — Telephone Encounter (Signed)
 Spoke to neurology. Was advised that they just spoke with patients mom and request was sent to Dr Malvin Johns

## 2023-10-08 NOTE — Telephone Encounter (Signed)
 Please call Dr Daisy Blossom office and let them know she needs a refill.  Let me know if any problems.

## 2023-10-09 NOTE — Telephone Encounter (Signed)
 See other phone note.  A message was sent to Dr Malvin Johns for refill since he prescribes. Please confirm has been refilled.

## 2023-10-10 NOTE — Telephone Encounter (Signed)
 Please refuse. Dr Malvin Johns refilled.

## 2023-10-12 NOTE — Telephone Encounter (Signed)
 Copied from CRM [id number]. Topic: General - Other >> Oct 12, 2023 12:09 PM Drema MATSU wrote: Reason for CRM: Patient mom is calling to see if forms she completed are ready for pickup. She states that she wants to pick it up today; please give patient's mom a call.

## 2023-10-12 NOTE — Telephone Encounter (Signed)
 Called patient to let her know that I would check status of her form but she cannot pick up today due to inclement weather and office closing. Pt gave verbal understanding and said she could just wait and pick up next week

## 2023-10-15 ENCOUNTER — Encounter: Payer: Self-pay | Admitting: Internal Medicine

## 2023-10-15 DIAGNOSIS — Z0289 Encounter for other administrative examinations: Secondary | ICD-10-CM | POA: Insufficient documentation

## 2023-10-15 NOTE — Assessment & Plan Note (Signed)
 Diagnosed as a child as outlined.

## 2023-10-15 NOTE — Assessment & Plan Note (Signed)
 Followed by neurology.  Continues on lamictal and depakote.  Now on keppra.  Mother has to distribute her medications to assure she is taking them and taking correctly.

## 2023-10-15 NOTE — Telephone Encounter (Signed)
 From in box. One question for mother. See me about this.

## 2023-10-15 NOTE — Assessment & Plan Note (Signed)
 Community alternatives form completed (for disabled adults).

## 2023-10-15 NOTE — Assessment & Plan Note (Signed)
 Requires direct supervision as outlined.  Mother has to help her with ADLs as outlined.  Also has to assure taking medications correctly.

## 2023-10-15 NOTE — Assessment & Plan Note (Signed)
Flonase as directed

## 2023-10-16 ENCOUNTER — Telehealth: Payer: Self-pay

## 2023-10-16 NOTE — Telephone Encounter (Signed)
 Copied from CRM [id number]. Topic: General - Other >> Oct 16, 2023  2:46 PM Corin V wrote: Reason for CRM: Patient is requesting a call back from Dr. Freda nurse as she was able to gather the information nursing had needed earlier for some paperwork. Please call patient back.

## 2023-10-16 NOTE — Telephone Encounter (Signed)
 Mom is going to clarify question that is left blank and let me know.

## 2023-10-16 NOTE — Telephone Encounter (Signed)
 See other note

## 2023-10-16 NOTE — Telephone Encounter (Signed)
 Copied from CRM [id number]. Topic: General - Other >> Oct 16, 2023  3:06 PM Theodis Sato wrote: Reason for CRM: Patients mom missed a call from Dr. Marina Goodell nurse and is requesting he to call back again at her earliest convenience

## 2023-10-16 NOTE — Telephone Encounter (Signed)
LM for mom.

## 2023-10-17 DIAGNOSIS — Z79899 Other long term (current) drug therapy: Secondary | ICD-10-CM | POA: Diagnosis not present

## 2023-10-17 DIAGNOSIS — R202 Paresthesia of skin: Secondary | ICD-10-CM | POA: Diagnosis not present

## 2023-10-17 DIAGNOSIS — R200 Anesthesia of skin: Secondary | ICD-10-CM | POA: Diagnosis not present

## 2023-10-17 DIAGNOSIS — R569 Unspecified convulsions: Secondary | ICD-10-CM | POA: Diagnosis not present

## 2023-10-17 DIAGNOSIS — R251 Tremor, unspecified: Secondary | ICD-10-CM | POA: Diagnosis not present

## 2023-10-17 DIAGNOSIS — R21 Rash and other nonspecific skin eruption: Secondary | ICD-10-CM | POA: Diagnosis not present

## 2023-10-17 DIAGNOSIS — G9349 Other encephalopathy: Secondary | ICD-10-CM | POA: Diagnosis not present

## 2023-10-17 NOTE — Telephone Encounter (Signed)
 Form completed and placed up front for pick up. Patients mom is aware.

## 2023-10-23 DIAGNOSIS — R569 Unspecified convulsions: Secondary | ICD-10-CM | POA: Diagnosis not present

## 2023-10-31 DIAGNOSIS — R569 Unspecified convulsions: Secondary | ICD-10-CM | POA: Diagnosis not present

## 2023-11-06 DIAGNOSIS — Z79899 Other long term (current) drug therapy: Secondary | ICD-10-CM | POA: Diagnosis not present

## 2023-11-06 DIAGNOSIS — R569 Unspecified convulsions: Secondary | ICD-10-CM | POA: Diagnosis not present

## 2023-11-06 DIAGNOSIS — R42 Dizziness and giddiness: Secondary | ICD-10-CM | POA: Diagnosis not present

## 2023-11-06 DIAGNOSIS — R400 Somnolence: Secondary | ICD-10-CM | POA: Diagnosis not present

## 2023-11-06 DIAGNOSIS — R251 Tremor, unspecified: Secondary | ICD-10-CM | POA: Diagnosis not present

## 2023-11-06 DIAGNOSIS — G9349 Other encephalopathy: Secondary | ICD-10-CM | POA: Diagnosis not present

## 2023-11-14 ENCOUNTER — Ambulatory Visit: Payer: 59

## 2023-12-19 DIAGNOSIS — R202 Paresthesia of skin: Secondary | ICD-10-CM | POA: Diagnosis not present

## 2023-12-19 DIAGNOSIS — Z79899 Other long term (current) drug therapy: Secondary | ICD-10-CM | POA: Diagnosis not present

## 2023-12-19 DIAGNOSIS — G9349 Other encephalopathy: Secondary | ICD-10-CM | POA: Diagnosis not present

## 2023-12-19 DIAGNOSIS — R569 Unspecified convulsions: Secondary | ICD-10-CM | POA: Diagnosis not present

## 2023-12-19 DIAGNOSIS — R42 Dizziness and giddiness: Secondary | ICD-10-CM | POA: Diagnosis not present

## 2023-12-19 DIAGNOSIS — R200 Anesthesia of skin: Secondary | ICD-10-CM | POA: Diagnosis not present

## 2023-12-19 DIAGNOSIS — R21 Rash and other nonspecific skin eruption: Secondary | ICD-10-CM | POA: Diagnosis not present

## 2023-12-19 DIAGNOSIS — R251 Tremor, unspecified: Secondary | ICD-10-CM | POA: Diagnosis not present

## 2023-12-19 DIAGNOSIS — R400 Somnolence: Secondary | ICD-10-CM | POA: Diagnosis not present

## 2023-12-30 ENCOUNTER — Other Ambulatory Visit: Payer: Self-pay | Admitting: Internal Medicine

## 2023-12-30 DIAGNOSIS — J302 Other seasonal allergic rhinitis: Secondary | ICD-10-CM

## 2024-01-14 ENCOUNTER — Ambulatory Visit: Payer: Self-pay

## 2024-01-14 NOTE — Telephone Encounter (Signed)
 Reason for Triage: Possible cyst infection? Pls call [phone] her mom   Chief Complaint: skin problem Symptoms: skin lesion on lower right abd under belly button, lesion - moderate pain Frequency: x couple of weeks Pertinent Negatives: Patient denies fever, Disposition: [] ED /[] Urgent Care (no appt availability in office) / [x] Appointment(In office/virtual)/ []  [facility]/ [] Home Care/ [] Refused Recommended Disposition /[] [facility]/ []  Follow-up with PCP Additional Notes: the lesion started started off small: has spread and now is the size of your hand.  Area was hard but now soft. No drainage or heat. Unknown the cause or what it is: Mom would like to get are evaluated & treated  Reason for Disposition  Caller can't describe it clearly  Answer Assessment - Initial Assessment Questions 1. APPEARANCE of LESION: "What does it look like?"      Red, raised ,  2. SIZE: "How big is it?" (e.g., inches, cm; or compare to size of pinhead, tip of pen, eraser, coin, pea, grape, ping pong ball)      Size of hand 3. COLOR: "What color is it?" "Is there more than one color?"     red 4. SHAPE: "What shape is it?" (e.g., round, irregular)     N/a 5. RAISED: "Does it stick up above the skin or is it flat?" (e.g., raised or elevated)     no 6. TENDER: "Does it hurt when you touch it?"  (Scale 1-10; or mild, moderate, severe)     moderate 7. LOCATION: "Where is it located?"      Low right abd under belly button 8. ONSET: "When did it first appear?"      X couple weeks 9. NUMBER: "Is there just one?" or "Are there others?"     1 10. CAUSE: "What do you think it is?"       unknown 11. OTHER SYMPTOMS: "Do you have any other symptoms?" (e.g., fever)       no 12. PREGNANCY: "Is there any chance you are pregnant?" "When was your last menstrual period?"       N/a  Protocols used: Skin Lesion - Moles or Growths-A-AH

## 2024-01-15 ENCOUNTER — Ambulatory Visit: Admitting: Family Medicine

## 2024-01-15 ENCOUNTER — Telehealth: Payer: Self-pay | Admitting: Internal Medicine

## 2024-01-15 ENCOUNTER — Ambulatory Visit

## 2024-01-15 DIAGNOSIS — Z08 Encounter for follow-up examination after completed treatment for malignant neoplasm: Secondary | ICD-10-CM

## 2024-01-15 NOTE — Telephone Encounter (Signed)
 Copied from CRM [id number]. Topic: General - Other >> Jan 15, 2024  3:01 PM [place] A wrote: Reason for CRM: Patient's mother called because she said she would not be at appointment until around 3:20PM agent advised her to reschedule. Ms. Dana Carroll declined to reschedule she said she would use urgent care

## 2024-01-15 NOTE — Telephone Encounter (Signed)
LM for mom.

## 2024-01-16 NOTE — Telephone Encounter (Signed)
 Copied from CRM [id number]. Topic: General - Other >> Jan 16, 2024  3:55 PM Trula Gable C wrote: Reason for CRM: Patient mom called in wanting to speak with Milana Ali , is requesting for her to give her a call back tomorrow

## 2024-01-17 NOTE — Telephone Encounter (Signed)
 Order placed for dermatology referral. Someone should be contacting with appt date and time.

## 2024-01-17 NOTE — Addendum Note (Signed)
 Addended by: Raejean Bullock on: 01/17/2024 12:42 PM   Modules accepted: Orders

## 2024-01-17 NOTE — Telephone Encounter (Signed)
 Patients mom called in wanting to have her daughter referred to a dermatologist for a routine skin check. Prefers female. Ok to place referral?

## 2024-01-17 NOTE — Telephone Encounter (Signed)
 Mom is aware

## 2024-01-17 NOTE — Telephone Encounter (Signed)
Order placed for dermatology referral.  

## 2024-01-22 ENCOUNTER — Ambulatory Visit (INDEPENDENT_AMBULATORY_CARE_PROVIDER_SITE_OTHER)

## 2024-01-22 DIAGNOSIS — L282 Other prurigo: Secondary | ICD-10-CM | POA: Insufficient documentation

## 2024-01-22 MED ORDER — KETOCONAZOLE 2 % EX SHAM: MEDICATED_SHAMPOO | CUTANEOUS | Status: AC

## 2024-01-22 MED ORDER — DIPHENHYDRAMINE-ZINC ACETATE 2-0.1 % EX CREA: TOPICAL_CREAM | Freq: Every evening | CUTANEOUS | Status: AC

## 2024-01-22 MED ORDER — KETOCONAZOLE 2 % EX CREA: 1.0000 | TOPICAL_CREAM | Freq: Two times a day (BID) | CUTANEOUS | 0 refills | Status: DC

## 2024-01-22 NOTE — Patient Instructions (Addendum)
-   Use ketoconazole  2 % shampoo, twice a week. You can do this for about a month and as needed after that. - Apply ketoconazole  2 % cream, twice a day, thin layer, twice a day till the redness is fully gone.  - No peroxide, picking of the skin.

## 2024-01-22 NOTE — Progress Notes (Signed)
   Acute Office Visit  Subjective:    Patient ID: Dana Carroll, female    DOB: November 13, 1975, 48 y.o.   MRN: [id number]  Chief Complaint  Patient presents with   Rash    Patient is in today for following acute concern Rash Chronicity: About a week ago on right inguinal canal. Rash characteristics: Red color, itchy in nature. She was exposed to nothing. Pertinent negatives include no diarrhea, fever, sore throat or vomiting. Treatments tried: Peroxide and betadine. Initially helped but not completely resolved.  Patient has been picking on the rash as it is pruritic.   Review of Systems  Constitutional:  Negative for fever.  HENT:  Negative for sore throat.   Gastrointestinal:  Negative for diarrhea and vomiting.  Skin:  Positive for rash.   As per HPI    Objective:    BP 110/70   Pulse 74   Temp 98 F (36.7 C) (Oral)   Ht 4\' 8"  (1.422 m)   Wt 159 lb 9.6 oz (72.4 kg)   SpO2 96%   BMI 35.78 kg/m    Physical Exam Cardiovascular:     Rate and Rhythm: Normal rate.  Skin:    Comments: Hyperkeratotic papule with crusty surface with well demarcated slightly erythematous rash with excoriation mark.  Neurological:     Mental Status: She is alert.  Psychiatric:        Mood and Affect: Mood normal.      No results found for any visits on 01/22/24.     Assessment & Plan:  Pleasant 48 year old female presenting with her mom for evaluation of single pruritic lesion. D/D includes prurigo nodularis tinea corporis, arthropod bite, atopic dermatitis.  Pruritic rash Assessment & Plan:  Recommend starting treatment with Ketoconazole  cream 2%, twice a day for 7 days, ketoconazole  shampoo 2%, twice a week for one month, Diphenhydramine -Zinc  cream 2-0.1% at night time prn. Recommend stopping use of any other ointment, antiseptic cream. Keep nail short to avoid scratching. If does not improve in 1 week or gets acutely worse recommend f/u with PCP.  Orders: -     Ketoconazole  -      Ketoconazole ; Apply 1 Application topically 2 (two) times daily.  Dispense: 60 g; Refill: 0 -     diphenhydrAMINE -Zinc  Acetate    Return if symptoms worsen or fail to improve.  Jacklin Mascot, MD

## 2024-01-22 NOTE — Assessment & Plan Note (Signed)
 Recommend starting treatment with Ketoconazole  cream 2%, twice a day for 7 days, ketoconazole  shampoo 2%, twice a week for one month, Diphenhydramine -Zinc  cream 2-0.1% at night time prn. Recommend stopping use of any other ointment, antiseptic cream. Keep nail short to avoid scratching. If does not improve in 1 week or gets acutely worse recommend f/u with PCP.

## 2024-02-11 ENCOUNTER — Ambulatory Visit: Payer: Self-pay

## 2024-02-11 NOTE — Telephone Encounter (Signed)
   Chief Complaint: cyst  Disposition: [] ED /[x] Urgent Care (no appt availability in office) / [] Appointment(In office/virtual)/ []  [facility]/ [] Home Care/ [] Refused Recommended Disposition /[] [facility]/ []  Follow-up with PCP Additional Notes: Pt mom Franky Ivanoff called for follow-up on 4/22 appt. Pt has cyst on right hip that is not getting better. Cyst is roughly an inch long and has turned black with red rash around it. Franky Ivanoff mentioned a black line that travels from center of cyst to belly button. Pt denies pain/fever but site does itch. Pt needs to be sen today due to travel tomorrow. No appts today. Franky Ivanoff stated she would take pt to UC.  RN gave care advice and Franky Ivanoff verbalized understanding.         Copied from CRM [id number]. Topic: Clinical - Red Word Triage >> Feb 11, 2024  1:06 PM Shereese L wrote: [facility] that prompted transfer to Nurse Triage: red/black cyst with rashes around on the right side of her hip area, itches Reason for Disposition  [1] Small swelling or lump AND [2] unexplained AND [3] present > 1 week  Answer Assessment - Initial Assessment Questions 1. APPEARANCE of SWELLING: "What does it look like?"     Black with red rash 2. SIZE: "How large is the swelling?" (e.g., inches, cm; or compare to size of pinhead, tip of pen, eraser, coin, pea, grape, ping pong ball)      An inch  3. LOCATION: "Where is the swelling located?"     Right hip  4. ONSET: "When did the swelling start?"     Early April  5. COLOR: "What color is it?" "Is there more than one color?"     Black and red  6. PAIN: "Is there any pain?" If Yes, ask: "How bad is the pain?" (e.g., scale 1-10; or mild, moderate, severe)     - NONE (0): no pain   - MILD (1-3): doesn't interfere with normal activities    - MODERATE (4-7): interferes with normal activities or awakens from sleep    - SEVERE (8-10): excruciating pain, unable to do any normal activities     Denies  7. ITCH: "Does it  itch?" If Yes, ask: "How bad is the itch?"      Yes  8. CAUSE: "What do you think caused the swelling?" 9 OTHER SYMPTOMS: "Do you have any other symptoms?" (e.g., fever)     denies  Protocols used: Skin Lump or Localized Swelling-A-AH

## 2024-02-11 NOTE — Telephone Encounter (Signed)
 FYI ... Pt is going to UC

## 2024-02-12 NOTE — Telephone Encounter (Signed)
 Noted.  Please f/u to confirm doing ok. See last note.

## 2024-02-12 NOTE — Telephone Encounter (Signed)
 Reviewed. Please confirm she was seen and doing ok.

## 2024-02-12 NOTE — Telephone Encounter (Signed)
 Called Patient to see if she was seen and how she is doing. Franky Ivanoff the mother states she is traveling and UC could not see them on 02/11/24. Patient states they are going to be seen today. I let Franky Ivanoff know we will call back and check on the Patient.

## 2024-02-13 NOTE — Telephone Encounter (Signed)
 LM to follow up with mom about how patient is doing.

## 2024-02-15 NOTE — Telephone Encounter (Signed)
 LM for mom to return call to update on patient

## 2024-02-20 NOTE — Telephone Encounter (Signed)
 Left message for Franky Ivanoff (Mother) to call back and see how the Patient is doing.

## 2024-04-16 DIAGNOSIS — G9349 Other encephalopathy: Secondary | ICD-10-CM | POA: Diagnosis not present

## 2024-04-16 DIAGNOSIS — R400 Somnolence: Secondary | ICD-10-CM | POA: Diagnosis not present

## 2024-04-16 DIAGNOSIS — R251 Tremor, unspecified: Secondary | ICD-10-CM | POA: Diagnosis not present

## 2024-04-16 DIAGNOSIS — R42 Dizziness and giddiness: Secondary | ICD-10-CM | POA: Diagnosis not present

## 2024-06-05 ENCOUNTER — Other Ambulatory Visit: Payer: Self-pay

## 2024-06-05 ENCOUNTER — Ambulatory Visit (INDEPENDENT_AMBULATORY_CARE_PROVIDER_SITE_OTHER): Admitting: Obstetrics

## 2024-06-05 ENCOUNTER — Encounter: Payer: Self-pay | Admitting: Internal Medicine

## 2024-06-05 ENCOUNTER — Encounter: Payer: Self-pay | Admitting: Obstetrics

## 2024-06-05 VITALS — BP 142/94 | Ht <= 58 in | Wt 153.0 lb

## 2024-06-05 DIAGNOSIS — Z975 Presence of (intrauterine) contraceptive device: Secondary | ICD-10-CM

## 2024-06-05 DIAGNOSIS — Z1211 Encounter for screening for malignant neoplasm of colon: Secondary | ICD-10-CM

## 2024-06-05 DIAGNOSIS — Z30431 Encounter for routine checking of intrauterine contraceptive device: Secondary | ICD-10-CM

## 2024-06-05 DIAGNOSIS — Z0000 Encounter for general adult medical examination without abnormal findings: Secondary | ICD-10-CM | POA: Insufficient documentation

## 2024-06-05 DIAGNOSIS — Z01419 Encounter for gynecological examination (general) (routine) without abnormal findings: Secondary | ICD-10-CM | POA: Diagnosis not present

## 2024-06-05 DIAGNOSIS — Z1231 Encounter for screening mammogram for malignant neoplasm of breast: Secondary | ICD-10-CM

## 2024-06-05 LAB — POCT URINALYSIS DIPSTICK
Bilirubin, UA: NEGATIVE
Blood, UA: NEGATIVE
Glucose, UA: NEGATIVE
Ketones, UA: NEGATIVE
Leukocytes, UA: NEGATIVE
Nitrite, UA: NEGATIVE
Protein, UA: NEGATIVE
Spec Grav, UA: 1.01 (ref 1.010–1.025)
Urobilinogen, UA: 0.2 U/dL
pH, UA: 6 (ref 5.0–8.0)

## 2024-06-05 MED ORDER — NA SULFATE-K SULFATE-MG SULF 17.5-3.13-1.6 GM/177ML PO SOLN
1.0000 | Freq: Once | ORAL | 0 refills | Status: AC
Start: 2024-06-05 — End: 2024-06-05

## 2024-06-05 NOTE — Progress Notes (Signed)
 ANNUAL GYNECOLOGICAL EXAM  SUBJECTIVE  HPI  Dana Carroll is a 48 y.o.-year-old [id number] who presents for an annual gynecological exam today.  She is here with her mother. She denies pelvic pain, abnormal vaginal bleeding or discharge, and UTI symptoms. She had a Mirena IUD placd 06/2020 and is not having any menstrual bleeding. She is not sexually active. She did have a cyst on  her labia that she would like examined. She has no other health concerns. Her BP is elevated today, which her mother says occurs right after she takes her seizure medication. She is being followed by her PCP for this.  Medical/Surgical History Past Medical History:  Diagnosis Date   Cancer (HCC)    Depression    Leukemia (HCC)    Personal history of chemotherapy    Personal history of radiation therapy    Seizures (HCC)    Past Surgical History:  Procedure Laterality Date   BREAST SURGERY     GALLBLADDER SURGERY     REDUCTION MAMMAPLASTY Bilateral 06/2010    Social History Lives with mother and 2 cats. Feels safe there Work: N/A Exercise: treadmill, walking Substances: Denies EtOH, tobacco, vape, and recreational drugs  Obstetric History OB History     Gravida  0   Para  0   Term  0   Preterm  0   AB  0   Living  0      SAB  0   IAB  0   Ectopic  0   Multiple  0   Live Births  0            GYN/Menstrual History No LMP recorded. (Menstrual status: IUD). Not menstruating Last Pap: 08/01/2022. NILM, negative HPV Contraception: IUD, abstinence  Prevention Dentist Eye exam Mammogram Colonoscopy: ordered   Current Medications Outpatient Medications Prior to Visit  Medication Sig   divalproex  (DEPAKOTE ) 125 MG DR tablet Take 250 mg by mouth.   busPIRone (BUSPAR) 5 MG tablet Take 1 tablet by mouth 2 (two) times daily.   divalproex  (DEPAKOTE ) 125 MG DR tablet TAKE 1 TABLET BY MOUTH DAILY (Patient taking differently: TAKE 1 TABLET BY MOUTH IN THE MORNING AND 2  TABLETS AT NIGHT)   fluticasone  (FLONASE ) 50 MCG/ACT nasal spray SPRAY 2 SPRAYS INTO EACH NOSTRIL EVERY DAY   ketoconazole  (NIZORAL ) 2 % cream Apply 1 Application topically 2 (two) times daily. (Patient not taking: Reported on 06/05/2024)   lacosamide (VIMPAT) 50 MG TABS tablet Take 50 mg by mouth.   lamoTRIgine  (LAMICTAL ) 150 MG tablet Take 150 mg by mouth daily.   lamoTRIgine  (LAMICTAL ) 25 MG tablet Take 75 mg by mouth every morning.   levETIRAcetam (KEPPRA) 500 MG tablet Take 250 mg by mouth daily.   loratadine (CLARITIN) 10 MG tablet Take 10 mg by mouth daily.   MAGNESIUM PO Take 400 mg by mouth daily.   vitamin B-12 (CYANOCOBALAMIN) 100 MCG tablet Take 100 mcg by mouth daily.   vitamin C (ASCORBIC ACID) 250 MG tablet Take 250 mg by mouth daily.   Facility-Administered Medications Prior to Visit  Medication Dose Route Frequency Provider   diphenhydrAMINE -zinc  acetate (BENADRYL ) 2-0.1 % cream   Topical Nightly    ketoconazole  (NIZORAL ) 2 % shampoo   Topical Once per day on Monday Thursday         ROS Constitutional: Denied constitutional symptoms, night sweats, recent illness, fatigue, fever, insomnia and weight loss.  Eyes: Denied eye symptoms, eye pain, photophobia, vision change and  visual disturbance.  Ears/Nose/Throat/Neck: Denied ear, nose, throat or neck symptoms, hearing loss, nasal discharge, sinus congestion and sore throat.  Cardiovascular: Denied cardiovascular symptoms, arrhythmia, chest pain/pressure, edema, exercise intolerance, orthopnea and palpitations.  Respiratory: Denied pulmonary symptoms, asthma, pleuritic pain, productive sputum, cough, dyspnea and wheezing.  Gastrointestinal: Denied gastro-esophageal reflux, melena, nausea and vomiting.  Genitourinary: Denied genitourinary symptoms including symptomatic vaginal discharge, pelvic relaxation issues, and urinary complaints.  Musculoskeletal: Denied musculoskeletal symptoms, stiffness, swelling, muscle weakness and  myalgia.  Dermatologic: Denied dermatology symptoms, rash and scar.  Neurologic: Denied neurology symptoms, dizziness, headache, neck pain and syncope.  Psychiatric: Denied psychiatric symptoms, anxiety and depression.  Endocrine: Denied endocrine symptoms including hot flashes and night sweats.    OBJECTIVE  BP (!) 142/94   Ht 4' 8 (1.422 m)   Wt 153 lb (69.4 kg)   BMI 34.30 kg/m    Physical examination General NAD, Conversant  HEENT Atraumatic; Op clear with mmm.  Normo-cephalic. Pupils reactive. Anicteric sclerae  Thyroid /Neck Smooth without nodularity or enlargement. Normal ROM.  Neck Supple.  Skin No rashes, lesions or ulceration. Normal palpated skin turgor. No nodularity.  Breasts: No masses or discharge.  Symmetric.  No axillary adenopathy.  Lungs: Clear to auscultation.No rales or wheezes. Normal Respiratory effort, no retractions.  Heart: NSR.  No murmurs or rubs appreciated. No peripheral edema  Abdomen: Soft.  Non-tender.  No masses.  No HSM. No hernia  Extremities: Moves all appropriately.  Normal ROM for age. No lymphadenopathy.  Neuro: Oriented to PPT.  Normal mood. Normal affect.     Pelvic:   Vulva: Normal appearance.  No lesions.  Perineum: Normal exam.  No lesions. Healing cyst below right labia, no s/s of infecton or drainage    ASSESSMENT  1) Annual exam 2) Elevated BP reading 3) Healing cyst 4) IUD in place  PLAN 1) Physical exam as noted. Discussed healthy lifestyle choices and preventive care. Declines STI testing. Labs: A1C, BMP, CBC, lipids, TSH. Pap due 2028. Mammogram and colonoscopy ordered. 2) Will f/u with PCP 3) Discussed comfort measures, s/s of infection 4) IUD due to be replaced 2029 or sooner if heavy bleeding returns  Return in one year for annual exam or as needed for concerns.   Yuki Brunsman, CNM

## 2024-06-06 LAB — CBC
Hematocrit: 40.7 % (ref 34.0–46.6)
Hemoglobin: 13.3 g/dL (ref 11.1–15.9)
MCH: 27.1 pg (ref 26.6–33.0)
MCHC: 32.7 g/dL (ref 31.5–35.7)
MCV: 83 fL (ref 79–97)
Platelets: 308 x10E3/uL (ref 150–450)
RBC: 4.91 x10E6/uL (ref 3.77–5.28)
RDW: 16.1 % — ABNORMAL HIGH (ref 11.7–15.4)
WBC: 8.3 x10E3/uL (ref 3.4–10.8)

## 2024-06-06 LAB — BASIC METABOLIC PANEL WITH GFR
BUN/Creatinine Ratio: 12 (ref 9–23)
BUN: 14 mg/dL (ref 6–24)
CO2: 26 mmol/L (ref 20–29)
Calcium: 9.8 mg/dL (ref 8.7–10.2)
Chloride: 105 mmol/L (ref 96–106)
Creatinine, Ser: 1.13 mg/dL — ABNORMAL HIGH (ref 0.57–1.00)
Glucose: 108 mg/dL — ABNORMAL HIGH (ref 70–99)
Potassium: 3.9 mmol/L (ref 3.5–5.2)
Sodium: 145 mmol/L — ABNORMAL HIGH (ref 134–144)
eGFR: 61 mL/min/1.73 (ref >59)

## 2024-06-06 LAB — TSH: TSH: 3.2 u[IU]/mL (ref 0.450–4.500)

## 2024-06-06 LAB — LIPID PANEL
Chol/HDL Ratio: 5 ratio — ABNORMAL HIGH (ref 0.0–4.4)
Cholesterol, Total: 191 mg/dL (ref 100–199)
HDL: 38 mg/dL — ABNORMAL LOW (ref >39)
LDL Chol Calc (NIH): 120 mg/dL — ABNORMAL HIGH (ref 0–99)
Triglycerides: 185 mg/dL — ABNORMAL HIGH (ref 0–149)
VLDL Cholesterol Cal: 33 mg/dL (ref 5–40)

## 2024-06-06 LAB — HEMOGLOBIN A1C
Est. average glucose Bld gHb Est-mCnc: 103 mg/dL
Hgb A1c MFr Bld: 5.2 % (ref 4.8–5.6)

## 2024-06-09 ENCOUNTER — Telehealth: Payer: Self-pay

## 2024-06-09 NOTE — Telephone Encounter (Signed)
 Neurology clearance from Dr.zachary Potter received -patient cleared to have procedure-benefits outweigh risks.

## 2024-06-26 ENCOUNTER — Ambulatory Visit (INDEPENDENT_AMBULATORY_CARE_PROVIDER_SITE_OTHER): Admitting: Internal Medicine

## 2024-06-26 VITALS — BP 126/70 | HR 82 | Resp 16 | Ht <= 58 in | Wt 149.6 lb

## 2024-06-26 DIAGNOSIS — E559 Vitamin D deficiency, unspecified: Secondary | ICD-10-CM | POA: Diagnosis not present

## 2024-06-26 DIAGNOSIS — Z975 Presence of (intrauterine) contraceptive device: Secondary | ICD-10-CM

## 2024-06-26 DIAGNOSIS — E7800 Pure hypercholesterolemia, unspecified: Secondary | ICD-10-CM

## 2024-06-26 DIAGNOSIS — Z124 Encounter for screening for malignant neoplasm of cervix: Secondary | ICD-10-CM

## 2024-06-26 DIAGNOSIS — G40909 Epilepsy, unspecified, not intractable, without status epilepticus: Secondary | ICD-10-CM | POA: Diagnosis not present

## 2024-06-26 NOTE — Progress Notes (Signed)
 Subjective:    Patient ID: Dana Carroll, female    DOB: 01-07-1976, 48 y.o.   MRN: [id number]  Patient here for  Chief Complaint  Patient presents with   Medical Management of Chronic Issues    HPI Here for follow up appt.  Was initially scheduled for work in with concerns regarding - cyst. Of note, saw gyn 06/05/24 - annual gyn exam. S/p IUD placement 06/2020. PAP due 2028. IUD due to be replaced 2029. Per review, had healing cyst below right labia noted - gyn visit.  Healed. Not an issue now. Appt changed to f/u appt. Saw neurology 12/2023 - recommended continue depakote , keppra, vimpat, lamictl and buspar. She is accompanied by her mother. History obtained from both of them. Reports some intermittent dizziness. Had a fall two weeks ago. Scraped her knee. No head injury. Knee is fine now. States the dizziness occurs around the time she takes her lacosamide. Mother reports will notice some unsteadiness and pt reports dizziness. Will lie down. Sometime falls asleep and when wakes up - better. Not an issue in the morning. Breathing stable. No abdominal pain or bowel change.    Past Medical History:  Diagnosis Date   Cancer (HCC)    Depression    Leukemia (HCC)    Personal history of chemotherapy    Personal history of radiation therapy    Seizures (HCC)    Past Surgical History:  Procedure Laterality Date   BREAST SURGERY     GALLBLADDER SURGERY     REDUCTION MAMMAPLASTY Bilateral 06/2010   Family History  Problem Relation Age of Onset   High blood pressure Mother    Heart failure Mother        09/2016   Cancer Father    Diabetes Other    High blood pressure Other    Social History   Socioeconomic History   Marital status: Single    Spouse name: Not on file   Number of children: Not on file   Years of education: HS Grad   Highest education level: Not on file  Occupational History   Occupation: Unemployed   Tobacco Use   Smoking status: Never   Smokeless tobacco:  Never  Vaping Use   Vaping status: Never Used  Substance and Sexual Activity   Alcohol use: No   Drug use: No   Sexual activity: Not Currently  Other Topics Concern   Not on file  Social History Narrative   Patient lives at home with boyfriend, Florina   Patient does not work.    Patient is single.    Patient drinks caffeine rare.    Social Drivers of Corporate investment banker Strain: Not on file  Food Insecurity: Not on file  Transportation Needs: Not on file  Physical Activity: Not on file  Stress: Not on file  Social Connections: Not on file     Review of Systems  Constitutional:  Negative for appetite change and unexpected weight change.  HENT:  Negative for congestion and sinus pressure.   Respiratory:  Negative for cough, chest tightness and shortness of breath.   Cardiovascular:  Negative for chest pain, palpitations and leg swelling.  Gastrointestinal:  Negative for abdominal pain, diarrhea, nausea and vomiting.  Genitourinary:  Negative for difficulty urinating and dysuria.  Musculoskeletal:  Negative for joint swelling and myalgias.  Skin:  Negative for color change and rash.  Neurological:  Positive for dizziness. Negative for headaches.  Psychiatric/Behavioral:  Negative for agitation  and dysphoric mood.        Objective:     BP 126/70   Pulse 82   Resp 16   Ht 4' 8 (1.422 m)   Wt 149 lb 9.6 oz (67.9 kg)   SpO2 98%   BMI 33.54 kg/m  Wt Readings from Last 3 Encounters:  06/26/24 149 lb 9.6 oz (67.9 kg)  06/05/24 153 lb (69.4 kg)  01/22/24 159 lb 9.6 oz (72.4 kg)    Physical Exam Vitals reviewed.  Constitutional:      General: She is not in acute distress.    Appearance: Normal appearance.  HENT:     Head: Normocephalic and atraumatic.     Right Ear: External ear normal.     Left Ear: External ear normal.     Mouth/Throat:     Pharynx: No oropharyngeal exudate or posterior oropharyngeal erythema.  Eyes:     General: No scleral icterus.        Right eye: No discharge.        Left eye: No discharge.     Conjunctiva/sclera: Conjunctivae normal.  Neck:     Thyroid : No thyromegaly.  Cardiovascular:     Rate and Rhythm: Normal rate and regular rhythm.  Pulmonary:     Effort: No respiratory distress.     Breath sounds: Normal breath sounds. No wheezing.  Abdominal:     General: Bowel sounds are normal.     Palpations: Abdomen is soft.     Tenderness: There is no abdominal tenderness.  Musculoskeletal:        General: No swelling or tenderness.     Cervical back: Neck supple. No tenderness.  Lymphadenopathy:     Cervical: No cervical adenopathy.  Skin:    Findings: No erythema or rash.  Neurological:     Mental Status: She is alert.  Psychiatric:        Mood and Affect: Mood normal.        Behavior: Behavior normal.         Outpatient Encounter Medications as of 06/26/2024  Medication Sig   busPIRone (BUSPAR) 5 MG tablet Take 1 tablet by mouth 2 (two) times daily.   divalproex  (DEPAKOTE ) 125 MG DR tablet Take 250 mg by mouth.   fluticasone  (FLONASE ) 50 MCG/ACT nasal spray SPRAY 2 SPRAYS INTO EACH NOSTRIL EVERY DAY   lacosamide (VIMPAT) 50 MG TABS tablet Take 50 mg by mouth.   lamoTRIgine  (LAMICTAL ) 150 MG tablet Take 150 mg by mouth daily.   lamoTRIgine  (LAMICTAL ) 25 MG tablet Take 75 mg by mouth every morning.   levETIRAcetam (KEPPRA) 500 MG tablet Take 250 mg by mouth daily.   loratadine (CLARITIN) 10 MG tablet Take 10 mg by mouth daily.   MAGNESIUM PO Take 400 mg by mouth daily.   vitamin B-12 (CYANOCOBALAMIN) 100 MCG tablet Take 100 mcg by mouth daily.   vitamin C (ASCORBIC ACID) 250 MG tablet Take 250 mg by mouth daily.   [DISCONTINUED] divalproex  (DEPAKOTE ) 125 MG DR tablet TAKE 1 TABLET BY MOUTH DAILY (Patient taking differently: TAKE 1 TABLET BY MOUTH IN THE MORNING AND 2 TABLETS AT NIGHT)   [DISCONTINUED] ketoconazole  (NIZORAL ) 2 % cream Apply 1 Application topically 2 (two) times daily. (Patient not  taking: Reported on 06/05/2024)   Facility-Administered Encounter Medications as of 06/26/2024  Medication   diphenhydrAMINE -zinc  acetate (BENADRYL ) 2-0.1 % cream   ketoconazole  (NIZORAL ) 2 % shampoo     Lab Results  Component Value Date  WBC 8.3 06/05/2024   HGB 13.3 06/05/2024   HCT 40.7 06/05/2024   PLT 308 06/05/2024   GLUCOSE 108 (H) 06/05/2024   CHOL 191 06/05/2024   TRIG 185 (H) 06/05/2024   HDL 38 (L) 06/05/2024   LDLCALC 120 (H) 06/05/2024   ALT 10 01/18/2023   AST 14 01/18/2023   NA 145 (H) 06/05/2024   K 3.9 06/05/2024   CL 105 06/05/2024   CREATININE 1.13 (H) 06/05/2024   BUN 14 06/05/2024   CO2 26 06/05/2024   TSH 3.200 06/05/2024   HGBA1C 5.2 06/05/2024    MR BRAIN W CONTRAST Result Date: 07/27/2023 CLINICAL DATA:  Provided history: Abnormal brain MRI.  Seizures. EXAM: MRI HEAD WITH CONTRAST TECHNIQUE: Multiplanar, multiecho pulse sequences of the brain and surrounding structures were obtained with intravenous contrast. CONTRAST:  7mL GADAVIST  GADOBUTROL  1 MMOL/ML IV SOLN COMPARISON:  None. FINDINGS: A contrast-enhanced brain MRI was performed as a follow-up to the recent prior non-contrast brain MRI of 07/24/2023. The following sequences were acquired for today's examination: Axial 3D pre-contrast T1, coronal T2, axial 3D T1 post-contrast, coronal T1 post-contrast, sagittal T1 post-contrast. Brain: The 5 mm rounded focus along the mid falx described on the recent prior brain MRI demonstrates homogeneous enhancement and is most consistent with a small meningioma (series 7, image 117). There is no significant mass effect upon the adjacent brain parenchyma. No adjacent parenchymal edema. No pathologic intracranial enhancement identified elsewhere. Vascular: Maintained flow voids within the proximal large arterial vessels. Enhancement of the proximal large arterial vessels and dural venous sinuses. Skull and upper cervical spine: No focal worrisome marrow lesion.  Sinuses/Orbits: No pathologic orbital enhancement. No significant paranasal sinus disease. These results will be called to the ordering clinician or representative by the Radiologist Assistant, and communication documented in the PACS or [employer]. IMPRESSION: The 5 mm rounded focus along the mid falx described on the recent prior brain MRI of 07/24/2023 demonstrates homogeneous enhancement. This is most consistent with a meningioma. There is no significant mass effect upon the adjacent brain parenchyma. No adjacent parenchymal edema. Electronically Signed   By: Rockey Childs D.O.   On: 07/27/2023 20:20       Assessment & Plan:  Hypercholesteremia Assessment & Plan: The 10-year ASCVD risk score (Arnett DK, et al., 2019) is: 1.7%   Values used to calculate the score:     Age: 71 years     Clincally relevant sex: Female     Is Non-Hispanic African American: No     Diabetic: No     Tobacco smoker: No     Systolic Blood Pressure: 142 mmHg     Is BP treated: No     HDL Cholesterol: 38 mg/dL     Total Cholesterol: 191 mg/dL  Low cholesterol diet and exercise. Follow lipid panel.    Vitamin D  deficiency Assessment & Plan: Check vitamin D  level with next labs.    Seizure disorder [facility]) Assessment & Plan: Followed by neurology.  Continues on lamictal , depakote , keppra and vimpat.  Concern that vimpat is contributing to dizziness as outlined. Notices after taking this medication in the afternoon. Will need to discuss with neurology regarding adjustments in medication. Blood pressure ok.     Presence of Mirena IUD Assessment & Plan: Saw gyn. IUD placed 06/2020. Due to be replaced 2029.    Cervical cancer screening Assessment & Plan: Seeing gyn. Evaluated 06/05/24 - PAP due 2028.       Allena Hamilton, MD

## 2024-06-26 NOTE — Assessment & Plan Note (Addendum)
 The 10-year ASCVD risk score (Arnett DK, et al., 2019) is: 1.7%   Values used to calculate the score:     Age: 48 years     Clincally relevant sex: Female     Is Non-Hispanic African American: No     Diabetic: No     Tobacco smoker: No     Systolic Blood Pressure: 142 mmHg     Is BP treated: No     HDL Cholesterol: 38 mg/dL     Total Cholesterol: 191 mg/dL  Low cholesterol diet and exercise. Follow lipid panel.

## 2024-06-30 ENCOUNTER — Encounter: Payer: Self-pay | Admitting: Internal Medicine

## 2024-06-30 NOTE — Assessment & Plan Note (Signed)
 Saw gyn. IUD placed 06/2020. Due to be replaced 2029.

## 2024-06-30 NOTE — Assessment & Plan Note (Signed)
 Check vitamin D level with next labs.  ?

## 2024-06-30 NOTE — Assessment & Plan Note (Signed)
 Seeing gyn. Evaluated 06/05/24 - PAP due 2028.

## 2024-06-30 NOTE — Assessment & Plan Note (Signed)
 Followed by neurology.  Continues on lamictal , depakote , keppra and vimpat.  Concern that vimpat is contributing to dizziness as outlined. Notices after taking this medication in the afternoon. Will need to discuss with neurology regarding adjustments in medication. Blood pressure ok.

## 2024-07-17 DIAGNOSIS — R42 Dizziness and giddiness: Secondary | ICD-10-CM | POA: Diagnosis not present

## 2024-07-17 DIAGNOSIS — R251 Tremor, unspecified: Secondary | ICD-10-CM | POA: Diagnosis not present

## 2024-07-17 DIAGNOSIS — R400 Somnolence: Secondary | ICD-10-CM | POA: Diagnosis not present

## 2024-07-17 DIAGNOSIS — G9349 Other encephalopathy: Secondary | ICD-10-CM | POA: Diagnosis not present

## 2024-08-07 ENCOUNTER — Ambulatory Visit

## 2024-08-07 DIAGNOSIS — L700 Acne vulgaris: Secondary | ICD-10-CM

## 2024-08-07 DIAGNOSIS — L814 Other melanin hyperpigmentation: Secondary | ICD-10-CM

## 2024-08-07 DIAGNOSIS — L810 Postinflammatory hyperpigmentation: Secondary | ICD-10-CM | POA: Diagnosis not present

## 2024-08-07 DIAGNOSIS — L821 Other seborrheic keratosis: Secondary | ICD-10-CM

## 2024-08-07 DIAGNOSIS — L858 Other specified epidermal thickening: Secondary | ICD-10-CM | POA: Diagnosis not present

## 2024-08-07 MED ORDER — TRIAMCINOLONE ACETONIDE 0.1 % EX OINT: TOPICAL_OINTMENT | CUTANEOUS | 5 refills | Status: AC

## 2024-08-07 MED ORDER — TRETINOIN 0.025 % EX CREA: TOPICAL_CREAM | Freq: Every day | CUTANEOUS | 5 refills | Status: AC

## 2024-08-07 NOTE — Patient Instructions (Addendum)
 (For the back)- recommend starting a mild over-the-counter cream with alpha-hydroxy-acids (lactic acid), such as Amlactin    (For acne) Start tretinoin 0.025% cream in the evening. Educated patient about proper use and potential side effects, including dryness, irritation, sun sensitivity, and transient worsening of acne. Start with applying three times a week and work way up to nightly. If too irritating, may discontinue.    Steroid Use  We prescribed you a topical steroid at today's visit.   General application instructions: -Apply this to any affected skin areas, twice (2 times) daily, for two (2) weeks -If the areas are better, you can stop -Re-start the topical steroid if the areas come back, or flare -If the areas don't get better after two weeks, we sometimes recommend taking a break for one (1) week, before restarting for another two (2) weeks. Repeat as needed  The most common side effects of topical steroid medications include changes in skin pigment and thinning of the skin. If the steroid is only applied to affected areas of the skin, these effects rarely occur unless the steroid is used for a very long time (years without stopping).   If we prescribed you a strong steroid, please avoid applying to face, groin, or neck, unless we tell you otherwise. We will include more detail in your prescription instructions.    Due to recent changes in healthcare laws, you may see results of your pathology and/or laboratory studies on MyChart before the doctors have had a chance to review them. We understand that in some cases there may be results that are confusing or concerning to you. Please understand that not all results are received at the same time and often the doctors may need to interpret multiple results in order to provide you with the best plan of care or course of treatment. Therefore, we ask that you please give us  2 business days to thoroughly review all your results before  contacting the office for clarification. Should we see a critical lab result, you will be contacted sooner.   If You Need Anything After Your Visit  If you have any questions or concerns for your doctor, please call our main line at [phone] and press option 4 to reach your doctor's medical assistant. If no one answers, please leave a voicemail as directed and we will return your call as soon as possible. Messages left after 4 pm will be answered the following business day.   You may also send us  a message via MyChart. We typically respond to MyChart messages within 1-2 business days.  For prescription refills, please ask your pharmacy to contact our office. Our fax number is [phone].  If you have an urgent issue when the clinic is closed that cannot wait until the next business day, you can page your doctor at the number below.    Please note that while we do our best to be available for urgent issues outside of office hours, we are not available 24/7.   If you have an urgent issue and are unable to reach us , you may choose to seek medical care at your doctor's office, retail clinic, urgent care center, or emergency room.  If you have a medical emergency, please immediately call 911 or go to the emergency department.  Pager Numbers  - Dr. Hester: [phone]  - Dr. Jackquline: [phone]  - Dr. Claudene: [phone]   In the event of inclement weather, please call our main line at [phone] for an update on the  status of any delays or closures.  Dermatology Medication Tips: Please keep the boxes that topical medications come in in order to help keep track of the instructions about where and how to use these. Pharmacies typically print the medication instructions only on the boxes and not directly on the medication tubes.   If your medication is too expensive, please contact our office at [phone] option 4 or send us  a message through MyChart.   We are unable to tell  what your co-pay for medications will be in advance as this is different depending on your insurance coverage. However, we may be able to find a substitute medication at lower cost or fill out paperwork to get insurance to cover a needed medication.   If a prior authorization is required to get your medication covered by your insurance company, please allow us  1-2 business days to complete this process.  Drug prices often vary depending on where the prescription is filled and some pharmacies may offer cheaper prices.  The website www.goodrx.com contains coupons for medications through different pharmacies. The prices here do not account for what the cost may be with help from insurance (it may be cheaper with your insurance), but the website can give you the price if you did not use any insurance.  - You can print the associated coupon and take it with your prescription to the pharmacy.  - You may also stop by our office during regular business hours and pick up a GoodRx coupon card.  - If you need your prescription sent electronically to a different pharmacy, notify our office through [facility] or by phone at [phone] option 4.     Si Usted Necesita Algo Despus de Su Visita  Tambin puede enviarnos un mensaje a travs de Clinical Cytogeneticist. Por lo general respondemos a los mensajes de MyChart en el transcurso de 1 a 2 das hbiles.  Para renovar recetas, por favor pida a su farmacia que se ponga en contacto con nuestra oficina. Randi lakes de fax es [place] [phone].  Si tiene un asunto urgente cuando la clnica est cerrada y que no puede esperar hasta el siguiente da hbil, puede llamar/localizar a su doctor(a) al nmero que aparece a continuacin.   Por favor, tenga en cuenta que aunque hacemos todo lo posible para estar disponibles para asuntos urgentes fuera del horario de [place], no estamos disponibles las 24 horas del da, los 7 [address]  [address] de la [place].   Si tiene un problema  urgente y no puede comunicarse con nosotros, puede optar por buscar atencin mdica  en el consultorio de su doctor(a), en una clnica privada, en un centro de atencin urgente o en una sala de emergencias.  Si tiene engineer, drilling, por favor llame inmediatamente al 911 o vaya a la sala de emergencias.  Nmeros de bper  - Dr. Hester: [phone]  - Dra. Jackquline: [phone]  - Dr. Claudene: [phone]   En caso de inclemencias del tiempo, por favor llame a landry capes principal al [phone] para una actualizacin sobre el [place] de cualquier retraso o cierre.  Consejos para la medicacin en dermatologa: Por favor, guarde las cajas en las que vienen los medicamentos de uso tpico para ayudarle a seguir las instrucciones sobre dnde y cmo usarlos. Las farmacias generalmente imprimen las instrucciones del medicamento slo en las cajas y no directamente en los tubos del [place].   Si su medicamento es muy caro, por favor, pngase en contacto con nuestra oficina llamando al [phone]  y presione la opcin 4 o envenos un mensaje a travs de Clinical Cytogeneticist.   No podemos decirle cul ser su copago por los medicamentos por adelantado ya que esto es diferente dependiendo de la cobertura de su seguro. Sin embargo, es posible que podamos encontrar un medicamento sustituto a audiological scientist un formulario para que el seguro cubra el medicamento que se considera necesario.   Si se requiere una autorizacin previa para que su compaa de seguros cubra su medicamento, por favor permtanos de 1 a 2 das hbiles para completar este proceso.  Los precios de los medicamentos varan con frecuencia dependiendo del environmental consultant de dnde se surte la receta y alguna farmacias pueden ofrecer precios ms baratos.  El sitio web www.goodrx.com tiene cupones para medicamentos de health and safety inspector. Los precios aqu no tienen en cuenta lo que podra costar con la ayuda del seguro (puede ser ms barato  con su seguro), pero el sitio web puede darle el precio si no utiliz tourist information centre manager.  - Puede imprimir el cupn correspondiente y llevarlo con su receta a la farmacia.  - Tambin puede pasar por nuestra oficina durante el horario de atencin regular y education officer, museum una tarjeta de cupones de GoodRx.  - Si necesita que su receta se enve electrnicamente a una farmacia diferente, informe a nuestra oficina a travs de MyChart de [place] o por telfono llamando al [phone] y presione la opcin 4.

## 2024-08-07 NOTE — Progress Notes (Signed)
 Subjective   Dana Carroll is a 48 y.o. female who presents for the following: Rash. Patient is new patient.  Today patient reports: Patient is on Lamictal  has been on it for years and was referred by PCP for rash concerns at face and back, mother reports itchy, but not currently using anything.   Mother and nana is with patient and contributes to history.  Review of Systems:    No other skin or systemic complaints except as noted in HPI or Assessment and Plan.  The following portions of the chart were reviewed this encounter and updated as appropriate: medications, allergies, medical history  Relevant Medical History:  n/a   Objective  (SKPE) Well appearing patient in no apparent distress; mood and affect are within normal limits. Examination was performed of the: Focused Exam of: Face, back    Examination notable for: - Spiny follicular papules with mild surrounding erythema located on the posterior upper arms, back.  Acne w/ pih on face  Lentigines and SK on face  Examination limited by: Undergarments, Shoes or socks , Clothing, and Patient deferred removal       Assessment & Plan  (SKAP)   Keratosis pilaris - informed the patient that this is a benign common inherited disorder of unknown etiology that is characterized by keratin plugging of follicular orifices - discussed with the patient that various conditions are associated with this benign condition such as: high BMI, dry scaly legs, atopy, pregnancy, and hyperandrogenism in women - discussed the treatment strategy which centers around decreasing excessive skin roughness and follicular accentuation - recommend starting a mild over-the-counter cream with alpha-hydroxy-acids (lactic acid), such as Amlactin   start triamcinolone ointment 0.1% twice daily to affected areas of skin Discussed side effect of potent topical steroids including atrophy, dyspigmentation, striae, telangectasia, folliculitis, loss of skin  pigment, hair growth, tachyphylaxis, risk of systemic absorption with missuse.  Benign Lesions/ Findings:  - Lentinges - Seborrheic Keratosis - Reassurance provided regarding the benign appearance of lesions noted on exam today; no treatment is indicated in the absence of symptoms/changes. - Reinforced importance of photoprotective strategies including liberal and frequent sunscreen use of a broad-spectrum SPF 30 or greater, use of protective clothing, and sun avoidance for prevention of cutaneous malignancy and photoaging.  Counseled patient on the importance of regular self-skin monitoring as well as routine clinical skin examinations as scheduled.   Acne vulgaris w/ post inflammatory hyperpigmentation  - Chronic and persistent condition with duration or expected duration over one year. Condition is symptomatic and bothersome to patient. Patient is flaring and not currently at treatment goal.  - Discussed various treatment options with patient, as well as need for consistent use for at least 6-12 weeks for full efficacy.  - Reviewed treatment options, including side effects of topical agents, oral antibiotics, OCPs (if female), oral spironolactone (if female), and isotretinoin. Discussed that isotretinoin is the most effective  - After discussion opted to initiate: tretinoin 0.025% start tretinoin 0.025% cream in the evening. Educated patient about proper use and potential side effects, including dryness, irritation, sun sensitivity, and transient worsening of acne. Start with applying three times a week and work way up to nightly. If too irritating, may discontinue.     Level of service outlined above   Patient instructions (SKPI)   Procedures, orders, diagnosis for this visit:    There are no diagnoses linked to this encounter.  Return to clinic: Return if symptoms worsen or fail to improve.  I, Jacquelynn V. Wilfred, CMA, am acting as scribe for Lauraine JAYSON Kanaris, MD.  Documentation: I  have reviewed the above documentation for accuracy and completeness, and I agree with the above.  Lauraine JAYSON Kanaris, MD

## 2024-09-09 ENCOUNTER — Telehealth: Payer: Self-pay

## 2024-09-09 NOTE — Telephone Encounter (Signed)
 Per pt would like to cancel procedure. Pt would like a call once it 's canceled.

## 2024-09-10 NOTE — Telephone Encounter (Signed)
 Pts colonoscopy scheduled for 09/18/25 has been canceled.  LVM on her mothers number to make her  aware and mychart message sent as well.  Vicki in endo notified of cancellation.  Thanks,  [place], CMA

## 2024-09-18 ENCOUNTER — Ambulatory Visit: Admit: 2024-09-18 | Admitting: Gastroenterology

## 2024-09-18 SURGERY — COLONOSCOPY
Anesthesia: General

## 2024-10-21 ENCOUNTER — Ambulatory Visit (INDEPENDENT_AMBULATORY_CARE_PROVIDER_SITE_OTHER)

## 2024-10-21 DIAGNOSIS — Z23 Encounter for immunization: Secondary | ICD-10-CM | POA: Diagnosis not present

## 2024-10-21 NOTE — Progress Notes (Signed)
 Pt received Influenza vaccine injection in Left  deltoid  muscle. Pt tolerated it well with no complaints or concerns.

## 2024-12-25 ENCOUNTER — Ambulatory Visit: Admitting: Internal Medicine
